# Patient Record
Sex: Male | Born: 1935 | Race: White | Hispanic: No | Marital: Married | Smoking: Never smoker
Health system: Southern US, Community
[De-identification: ages and names within clinical notes are randomized; demographics above are authoritative.]

## PROBLEM LIST (undated history)

## (undated) DIAGNOSIS — I2510 Atherosclerotic heart disease of native coronary artery without angina pectoris: Secondary | ICD-10-CM

## (undated) DIAGNOSIS — I639 Cerebral infarction, unspecified: Secondary | ICD-10-CM

## (undated) DIAGNOSIS — I10 Essential (primary) hypertension: Secondary | ICD-10-CM

## (undated) DIAGNOSIS — G4733 Obstructive sleep apnea (adult) (pediatric): Secondary | ICD-10-CM

## (undated) DIAGNOSIS — E785 Hyperlipidemia, unspecified: Secondary | ICD-10-CM

## (undated) HISTORY — PX: APPENDECTOMY: SHX54

## (undated) HISTORY — DX: Atherosclerotic heart disease of native coronary artery without angina pectoris: I25.10

## (undated) HISTORY — DX: Hyperlipidemia, unspecified: E78.5

## (undated) HISTORY — DX: Essential (primary) hypertension: I10

## (undated) HISTORY — PX: OTHER SURGICAL HISTORY: SHX169

## (undated) HISTORY — DX: Cerebral infarction, unspecified: I63.9

## (undated) HISTORY — PX: BACK SURGERY: SHX140

## (undated) HISTORY — PX: TOTAL KNEE ARTHROPLASTY: SHX125

## (undated) HISTORY — DX: Obstructive sleep apnea (adult) (pediatric): G47.33

---

## 1998-03-07 ENCOUNTER — Inpatient Hospital Stay (HOSPITAL_COMMUNITY): Admission: EM | Admit: 1998-03-07 | Discharge: 1998-03-15 | Payer: Self-pay | Admitting: Emergency Medicine

## 1999-01-26 ENCOUNTER — Ambulatory Visit (HOSPITAL_COMMUNITY): Admission: RE | Admit: 1999-01-26 | Discharge: 1999-01-26 | Payer: Self-pay | Admitting: Interventional Cardiology

## 2002-01-25 ENCOUNTER — Emergency Department (HOSPITAL_COMMUNITY): Admission: EM | Admit: 2002-01-25 | Discharge: 2002-01-25 | Payer: Self-pay | Admitting: Emergency Medicine

## 2002-01-25 ENCOUNTER — Encounter: Payer: Self-pay | Admitting: Emergency Medicine

## 2002-02-21 ENCOUNTER — Ambulatory Visit (HOSPITAL_COMMUNITY): Admission: RE | Admit: 2002-02-21 | Discharge: 2002-02-21 | Payer: Self-pay

## 2002-06-11 ENCOUNTER — Emergency Department (HOSPITAL_COMMUNITY): Admission: EM | Admit: 2002-06-11 | Discharge: 2002-06-11 | Payer: Self-pay | Admitting: Emergency Medicine

## 2002-06-11 ENCOUNTER — Encounter: Payer: Self-pay | Admitting: Emergency Medicine

## 2003-12-23 ENCOUNTER — Ambulatory Visit (HOSPITAL_COMMUNITY): Admission: RE | Admit: 2003-12-23 | Discharge: 2003-12-23 | Payer: Self-pay | Admitting: Cardiology

## 2005-04-27 ENCOUNTER — Encounter: Admission: RE | Admit: 2005-04-27 | Discharge: 2005-04-27 | Payer: Self-pay | Admitting: Family Medicine

## 2005-07-16 ENCOUNTER — Inpatient Hospital Stay (HOSPITAL_COMMUNITY): Admission: RE | Admit: 2005-07-16 | Discharge: 2005-07-21 | Payer: Self-pay | Admitting: Orthopedic Surgery

## 2005-07-21 ENCOUNTER — Inpatient Hospital Stay
Admission: RE | Admit: 2005-07-21 | Discharge: 2005-07-27 | Payer: Self-pay | Admitting: Physical Medicine & Rehabilitation

## 2005-07-21 ENCOUNTER — Ambulatory Visit: Payer: Self-pay | Admitting: Physical Medicine & Rehabilitation

## 2005-08-31 ENCOUNTER — Ambulatory Visit: Payer: Self-pay | Admitting: Internal Medicine

## 2006-03-08 ENCOUNTER — Ambulatory Visit (HOSPITAL_COMMUNITY): Admission: RE | Admit: 2006-03-08 | Discharge: 2006-03-08 | Payer: Self-pay | Admitting: Orthopedic Surgery

## 2006-03-25 ENCOUNTER — Ambulatory Visit: Payer: Self-pay | Admitting: Family Medicine

## 2006-03-26 ENCOUNTER — Encounter: Admission: RE | Admit: 2006-03-26 | Discharge: 2006-03-26 | Payer: Self-pay | Admitting: Orthopedic Surgery

## 2006-03-26 ENCOUNTER — Ambulatory Visit: Payer: Self-pay | Admitting: Family Medicine

## 2006-04-06 ENCOUNTER — Ambulatory Visit (HOSPITAL_BASED_OUTPATIENT_CLINIC_OR_DEPARTMENT_OTHER): Admission: RE | Admit: 2006-04-06 | Discharge: 2006-04-06 | Payer: Self-pay | Admitting: Family Medicine

## 2006-04-07 ENCOUNTER — Ambulatory Visit: Payer: Self-pay | Admitting: Internal Medicine

## 2006-04-16 ENCOUNTER — Ambulatory Visit: Payer: Self-pay | Admitting: Gastroenterology

## 2006-04-16 ENCOUNTER — Encounter: Admission: RE | Admit: 2006-04-16 | Discharge: 2006-04-16 | Payer: Self-pay | Admitting: Orthopedic Surgery

## 2006-04-19 ENCOUNTER — Ambulatory Visit: Payer: Self-pay | Admitting: Internal Medicine

## 2006-05-06 ENCOUNTER — Ambulatory Visit: Payer: Self-pay | Admitting: Gastroenterology

## 2006-05-13 ENCOUNTER — Encounter: Admission: RE | Admit: 2006-05-13 | Discharge: 2006-05-13 | Payer: Self-pay | Admitting: Orthopedic Surgery

## 2006-05-24 ENCOUNTER — Ambulatory Visit: Payer: Self-pay | Admitting: Gastroenterology

## 2006-05-24 ENCOUNTER — Encounter: Payer: Self-pay | Admitting: Internal Medicine

## 2006-05-24 ENCOUNTER — Encounter (INDEPENDENT_AMBULATORY_CARE_PROVIDER_SITE_OTHER): Payer: Self-pay | Admitting: Specialist

## 2006-05-24 DIAGNOSIS — D126 Benign neoplasm of colon, unspecified: Secondary | ICD-10-CM | POA: Insufficient documentation

## 2006-05-24 LAB — HM COLONOSCOPY: HM Colonoscopy: ABNORMAL

## 2006-06-07 ENCOUNTER — Ambulatory Visit: Payer: Self-pay | Admitting: Family Medicine

## 2006-06-21 ENCOUNTER — Ambulatory Visit: Payer: Self-pay | Admitting: Family Medicine

## 2006-07-05 ENCOUNTER — Ambulatory Visit: Payer: Self-pay | Admitting: Family Medicine

## 2006-07-15 ENCOUNTER — Ambulatory Visit: Payer: Self-pay | Admitting: Family Medicine

## 2006-07-15 LAB — CONVERTED CEMR LAB
CO2: 29 meq/L (ref 19–32)
Calcium: 9.2 mg/dL (ref 8.4–10.5)
Creatinine, Ser: 1.5 mg/dL (ref 0.4–1.5)
GFR calc non Af Amer: 49 mL/min
Glomerular Filtration Rate, Af Am: 60 mL/min/{1.73_m2}
Glucose, Bld: 75 mg/dL (ref 70–99)
Potassium: 4.2 meq/L (ref 3.5–5.1)
Total Protein: 6.7 g/dL (ref 6.0–8.3)

## 2006-07-19 ENCOUNTER — Ambulatory Visit: Payer: Self-pay | Admitting: Family Medicine

## 2006-07-23 ENCOUNTER — Encounter: Admission: RE | Admit: 2006-07-23 | Discharge: 2006-07-23 | Payer: Self-pay | Admitting: Family Medicine

## 2006-07-30 ENCOUNTER — Encounter: Admission: RE | Admit: 2006-07-30 | Discharge: 2006-07-30 | Payer: Self-pay | Admitting: Orthopedic Surgery

## 2006-10-01 DIAGNOSIS — M810 Age-related osteoporosis without current pathological fracture: Secondary | ICD-10-CM | POA: Insufficient documentation

## 2006-10-01 DIAGNOSIS — I10 Essential (primary) hypertension: Secondary | ICD-10-CM | POA: Insufficient documentation

## 2006-10-01 DIAGNOSIS — R0681 Apnea, not elsewhere classified: Secondary | ICD-10-CM

## 2006-10-01 DIAGNOSIS — D649 Anemia, unspecified: Secondary | ICD-10-CM

## 2006-10-06 DIAGNOSIS — E119 Type 2 diabetes mellitus without complications: Secondary | ICD-10-CM | POA: Insufficient documentation

## 2006-10-06 DIAGNOSIS — I2510 Atherosclerotic heart disease of native coronary artery without angina pectoris: Secondary | ICD-10-CM | POA: Insufficient documentation

## 2006-10-06 DIAGNOSIS — E785 Hyperlipidemia, unspecified: Secondary | ICD-10-CM

## 2006-10-22 ENCOUNTER — Encounter: Admission: RE | Admit: 2006-10-22 | Discharge: 2006-10-22 | Payer: Self-pay | Admitting: Neurology

## 2006-12-04 ENCOUNTER — Ambulatory Visit: Payer: Self-pay | Admitting: Family Medicine

## 2007-02-27 ENCOUNTER — Encounter (INDEPENDENT_AMBULATORY_CARE_PROVIDER_SITE_OTHER): Payer: Self-pay | Admitting: Family Medicine

## 2007-05-05 ENCOUNTER — Encounter (INDEPENDENT_AMBULATORY_CARE_PROVIDER_SITE_OTHER): Payer: Self-pay | Admitting: Family Medicine

## 2007-12-01 ENCOUNTER — Encounter (INDEPENDENT_AMBULATORY_CARE_PROVIDER_SITE_OTHER): Payer: Self-pay | Admitting: Family Medicine

## 2008-04-16 ENCOUNTER — Encounter: Payer: Self-pay | Admitting: Family Medicine

## 2008-07-09 ENCOUNTER — Encounter: Payer: Self-pay | Admitting: Internal Medicine

## 2008-07-18 ENCOUNTER — Encounter: Payer: Self-pay | Admitting: Family Medicine

## 2008-07-19 ENCOUNTER — Ambulatory Visit: Payer: Self-pay | Admitting: Family Medicine

## 2008-07-19 ENCOUNTER — Telehealth (INDEPENDENT_AMBULATORY_CARE_PROVIDER_SITE_OTHER): Payer: Self-pay | Admitting: *Deleted

## 2008-07-19 LAB — CONVERTED CEMR LAB
AST: 22 units/L (ref 0–37)
Albumin: 3.6 g/dL (ref 3.5–5.2)
Alkaline Phosphatase: 93 units/L (ref 39–117)
BUN: 25 mg/dL — ABNORMAL HIGH (ref 6–23)
Basophils Relative: 0.6 % (ref 0.0–3.0)
Bilirubin Urine: NEGATIVE
Bilirubin, Direct: 0.2 mg/dL (ref 0.0–0.3)
Calcium: 9 mg/dL (ref 8.4–10.5)
Chloride: 106 meq/L (ref 96–112)
Creatinine, Ser: 1.5 mg/dL (ref 0.4–1.5)
Eosinophils Absolute: 0.1 10*3/uL (ref 0.0–0.7)
Eosinophils Relative: 1.1 % (ref 0.0–5.0)
GFR calc non Af Amer: 49 mL/min
Hemoglobin: 12.8 g/dL — ABNORMAL LOW (ref 13.0–17.0)
Lipase: 22 units/L (ref 11.0–59.0)
MCHC: 35.3 g/dL (ref 30.0–36.0)
MCV: 91.9 fL (ref 78.0–100.0)
Monocytes Absolute: 0.9 10*3/uL (ref 0.1–1.0)
Monocytes Relative: 9.9 % (ref 3.0–12.0)
Neutro Abs: 5.1 10*3/uL (ref 1.4–7.7)
Neutrophils Relative %: 59.3 % (ref 43.0–77.0)
Nitrite: NEGATIVE
RBC: 3.95 M/uL — ABNORMAL LOW (ref 4.22–5.81)
Specific Gravity, Urine: 1.02
WBC Urine, dipstick: NEGATIVE
WBC: 8.8 10*3/uL (ref 4.5–10.5)

## 2008-07-20 ENCOUNTER — Ambulatory Visit: Payer: Self-pay | Admitting: Internal Medicine

## 2008-07-20 ENCOUNTER — Telehealth (INDEPENDENT_AMBULATORY_CARE_PROVIDER_SITE_OTHER): Payer: Self-pay | Admitting: *Deleted

## 2008-12-08 ENCOUNTER — Ambulatory Visit: Payer: Self-pay | Admitting: Internal Medicine

## 2008-12-10 ENCOUNTER — Telehealth: Payer: Self-pay | Admitting: Internal Medicine

## 2008-12-14 ENCOUNTER — Telehealth: Payer: Self-pay | Admitting: Internal Medicine

## 2008-12-20 ENCOUNTER — Telehealth: Payer: Self-pay | Admitting: Internal Medicine

## 2008-12-27 ENCOUNTER — Ambulatory Visit: Payer: Self-pay | Admitting: Internal Medicine

## 2008-12-29 LAB — CONVERTED CEMR LAB
CO2: 28 meq/L (ref 19–32)
Chloride: 108 meq/L (ref 96–112)
Creatinine, Ser: 1.3 mg/dL (ref 0.4–1.5)
GFR calc non Af Amer: 57.5 mL/min (ref >60)
Glucose, Bld: 117 mg/dL — ABNORMAL HIGH (ref 70–99)
Iron: 80 ug/dL (ref 42–165)
PSA: 1.39 ng/mL (ref 0.10–4.00)
Vitamin B-12: 820 pg/mL (ref 211–911)

## 2009-01-05 ENCOUNTER — Encounter: Payer: Self-pay | Admitting: Internal Medicine

## 2009-01-24 ENCOUNTER — Telehealth (INDEPENDENT_AMBULATORY_CARE_PROVIDER_SITE_OTHER): Payer: Self-pay | Admitting: *Deleted

## 2009-02-04 ENCOUNTER — Encounter: Payer: Self-pay | Admitting: Internal Medicine

## 2009-04-23 ENCOUNTER — Emergency Department (HOSPITAL_COMMUNITY): Admission: EM | Admit: 2009-04-23 | Discharge: 2009-04-23 | Payer: Self-pay | Admitting: Emergency Medicine

## 2009-04-29 ENCOUNTER — Encounter: Payer: Self-pay | Admitting: Internal Medicine

## 2009-06-27 ENCOUNTER — Ambulatory Visit: Payer: Self-pay | Admitting: Internal Medicine

## 2009-06-27 DIAGNOSIS — K5900 Constipation, unspecified: Secondary | ICD-10-CM | POA: Insufficient documentation

## 2009-06-27 DIAGNOSIS — M1990 Unspecified osteoarthritis, unspecified site: Secondary | ICD-10-CM | POA: Insufficient documentation

## 2009-08-21 ENCOUNTER — Emergency Department (HOSPITAL_COMMUNITY): Admission: EM | Admit: 2009-08-21 | Discharge: 2009-08-21 | Payer: Self-pay | Admitting: Emergency Medicine

## 2009-10-18 ENCOUNTER — Encounter: Payer: Self-pay | Admitting: Internal Medicine

## 2010-05-30 ENCOUNTER — Encounter: Payer: Self-pay | Admitting: Internal Medicine

## 2010-06-12 ENCOUNTER — Telehealth: Payer: Self-pay | Admitting: Internal Medicine

## 2010-08-02 ENCOUNTER — Encounter: Payer: Self-pay | Admitting: Internal Medicine

## 2010-09-04 ENCOUNTER — Ambulatory Visit: Payer: Self-pay | Admitting: Family Medicine

## 2010-09-05 ENCOUNTER — Telehealth: Payer: Self-pay | Admitting: Internal Medicine

## 2010-09-20 ENCOUNTER — Telehealth: Payer: Self-pay | Admitting: Internal Medicine

## 2010-09-20 DIAGNOSIS — IMO0002 Reserved for concepts with insufficient information to code with codable children: Secondary | ICD-10-CM | POA: Insufficient documentation

## 2010-09-21 ENCOUNTER — Telehealth: Payer: Self-pay | Admitting: Internal Medicine

## 2010-10-03 ENCOUNTER — Encounter: Payer: Self-pay | Admitting: Internal Medicine

## 2010-10-08 ENCOUNTER — Encounter: Payer: Self-pay | Admitting: Neurology

## 2010-10-09 ENCOUNTER — Ambulatory Visit: Admit: 2010-10-09 | Payer: Self-pay | Admitting: Internal Medicine

## 2010-10-10 ENCOUNTER — Ambulatory Visit
Admission: RE | Admit: 2010-10-10 | Discharge: 2010-10-10 | Payer: Self-pay | Source: Home / Self Care | Attending: Internal Medicine | Admitting: Internal Medicine

## 2010-10-10 ENCOUNTER — Ambulatory Visit: Admit: 2010-10-10 | Payer: Self-pay | Admitting: Internal Medicine

## 2010-10-17 NOTE — Letter (Signed)
Summary: [facility] Endocrinology & Diabetes  [facility] Endocrinology & Diabetes   Imported By: Lanelle Bal 06/08/2010 07:55:14  _____________________________________________________________________  External Attachment:    Type:   Image     Comment:   External Document

## 2010-10-17 NOTE — Progress Notes (Signed)
Summary: Tramadol mail order  Phone Note Refill Request Message from:  spouse on June 12, 2010 3:30 PM  Refills Requested: Medication #1:  TRAMADOL HCL 50 MG TABS take one tablet in the morning and one ta blet in the evening as needed [facility]--90 day supply. Please advise.  Initial call taken by: Lucious Groves CMA,  June 12, 2010 3:31 PM  Follow-up for Phone Call        ok Rx 180 and 1 RF Follow-up by: [facility] E. Mianna Iezzi MD,  June 13, 2010 11:53 AM    Prescriptions: TRAMADOL HCL 50 MG TABS (TRAMADOL HCL) take one tablet in the morning and one ta blet in the evening as needed  #180 x 1   Entered by:   Army Fossa CMA   Authorized by:   Nolon Rod. Mahira Petras MD   Signed by:   Army Fossa CMA on 06/13/2010   Method used:   Faxed to ...       [facility] (mail-order)             , [place]         Ph:        Fax: [phone]   RxID:   [id number]

## 2010-10-17 NOTE — Letter (Signed)
Summary: [facility] Endocrinology & Diabetes  [facility]ology [facility] Diabetes   Imported By: Lanelle Bal 10/26/2009 08:54:27  _____________________________________________________________________  External Attachment:    Type:   Image     Comment:   External Document

## 2010-10-17 NOTE — Letter (Signed)
Summary: [facility]   Imported By: Lanelle Bal 08/15/2010 10:17:48  _____________________________________________________________________  External Attachment:    Type:   Image     Comment:   External Document

## 2010-10-19 NOTE — Progress Notes (Signed)
Summary: due for a office visit  Phone Note Outgoing Call   Summary of Call: advise  patient, he has not been seen in long time. He is due for a ROV. Please arrange if patient is willing to [facility] E. Paz MD  September 05, 2010 2:32 PM   Follow-up for Phone Call        Per pts wife- he is not ready for an OV at this time. He did see Dr.Tabori yesterday for an acute reason. Army Fossa CMA  September 05, 2010 2:35 PM

## 2010-10-19 NOTE — Assessment & Plan Note (Signed)
Summary: discuss referral   Vital Signs:  Patient profile:   77 year old male Weight:      230.38 pounds Pulse rate:   79 / minute Pulse rhythm:   regular BP sitting:   142 / 82  (left arm) Cuff size:   large  Vitals Entered By: Adrian Spencer (October 10, 2010 3:03 PM) CC: Pt states he is here to just "talk"  Comments not fasting  [facility]    History of Present Illness: patient here at the request of his wife Adrian Spencer), she reported a long history of a bizarre behavior on  [place], she said that his temper was getting worse. Adrian Spencer was seen a few weeks ago, she was definitely anxious and depressed and was prescribed SSRIs. I saw her today, she is doing much better and she reports that her relationship with her husband has improved tremendously.  Today, Adrian Spencer reports that   Adrian Spencer  has been quite "ill" and sometimes even verbally violent with him for the last few months. He feels that most of the problems between them were generated by Adrian Spencer's behavior  He admits that at some point he was a little "rough" during intercourse but he did not mean to hurt her. He also  admits  that from time to time he is in a  bad mood but nothing out of the ordinary. He does not believe that he has any behavioral or mental problem.  he agrees with his wife in the fact that she is doing much better on SSRIs.  ROS Denies any anxiety or depression per se No recent low CBGs   Current Medications (verified): 1)  Vytorin 10-80 Mg Tabs (Ezetimibe-Simvastatin) .... Take One Tablet Daily 2)  Actos 15 Mg Tabs (Pioglitazone Hcl) .... Take One Tablet Every Morning 3)  Glimepiride 2 Mg Tabs (Glimepiride) .... Take One Tablet Each Morning 4)  Isosorbide Mononitrate Cr 60 Mg Xr24h-Tab (Isosorbide Mononitrate) .... Take One Tablet Daily 5)  Metoprolol Tartrate 50 Mg Tabs (Metoprolol Tartrate) .... Take One Tablet in The Morning and One Tablet in The Evening. 6)  Tramadol Hcl 50 Mg Tabs  (Tramadol Hcl) .... Take One Tablet in The Morning and One Ta Blet in The Evening As Needed 7)  Welchol 625 Mg Tabs (Colesevelam Hcl) .... Take 2 Tablets in The Morning and 2 Tablets in The Evening 8)  Citracal Plus  Tabs (Multiple Minerals-Vitamins) .... Take 4  Tablet Daily 9)  Centrum Silver  Tabs (Multiple Vitamins-Minerals) .... Once Daily 10)  Aspirin 325 Mg Tabs (Aspirin) .Marland Kitchen.. 1 By Mouth Once Daily 11)  Niacin Cr 1000 Mg Cr-Tabs (Niacin) 12)  Tessalon 200 Mg Caps (Benzonatate) .... Take One Capsule By Mouth Three Times A Day As Needed For Cough 13)  Cheratussin Ac 100-10 Mg/45ml Syrp (Guaifenesin-Codeine) .Marland Kitchen.. 1-2 Tsps Q4-6 As Needed For Cough  Allergies (verified): No Known Drug Allergies  Past History:  Past Medical History: Reviewed history from 06/27/2009 and no changes required. Hypertension Osteoporosis Coronary artery disease, S/P CABG--Dr Katrinka Blazing Diabetes mellitus, type II-- Dr Lucianne Muss Hyperlipidemia-- Dr Lucianne Muss  stroke in the 80s OSA severe per Sleep study 7-07, has a CPAP hardly ever uses it  Past Surgical History: Reviewed history from 12/27/2008 and no changes required. Appendectomy Coronary artery bypass graft 1999 Total knee replacement Back Surgery  Physical Exam  General:  alert and well-developed.   Psych:  Oriented X3, memory intact for recent and remote, normally interactive, good eye contact, not anxious appearing, and  not depressed appearing.     Impression & Recommendations:  Problem # 1:  ? of ANXIETY (ICD-300.00) see history of present illness Here at the request of the wife, she reported bizarre behavior  and a  temper . Per patient's own report today and by my exam today, Adrian Spencer is doing well, no anxiety, depression, he seems collected. Actually the relationship between them has improved tremendously after  Adrian Spencer started SSRIs. Plan:  observation now that I have both sides of the story, I more worried about  Adrian Spencer than  Adrian Spencer. I asked him  to call me if Adrian Spencer gets worse  Complete Medication List: 1)  Vytorin 10-80 Mg Tabs (Ezetimibe-simvastatin) .... Take one tablet daily 2)  Actos 15 Mg Tabs (Pioglitazone hcl) .... Take one tablet every morning 3)  Glimepiride 2 Mg Tabs (Glimepiride) .... Take one tablet each morning 4)  Isosorbide Mononitrate Cr 60 Mg Xr24h-tab (Isosorbide mononitrate) .... Take one tablet daily 5)  Metoprolol Tartrate 50 Mg Tabs (Metoprolol tartrate) .... Take one tablet in the morning and one tablet in the evening. 6)  Tramadol Hcl 50 Mg Tabs (Tramadol hcl) .... Take one tablet in the morning and one ta blet in the evening as needed 7)  Welchol 625 Mg Tabs (Colesevelam hcl) .... Take 2 tablets in the morning and 2 tablets in the evening 8)  Citracal Plus Tabs (Multiple minerals-vitamins) .... Take 4  tablet daily 9)  Centrum Silver Tabs (Multiple vitamins-minerals) .... Once daily 10)  Aspirin 325 Mg Tabs (Aspirin) .Marland Kitchen.. 1 by mouth once daily 11)  Niacin Cr 1000 Mg Cr-tabs (Niacin) 12)  Tessalon 200 Mg Caps (Benzonatate) .... Take one capsule by mouth three times a day as needed for cough 13)  Cheratussin Ac 100-10 Mg/68ml Syrp (Guaifenesin-codeine) .Marland Kitchen.. 1-2 tsps q4-6 as needed for cough   Orders Added: 1)  Est. Patient Level III [96295]

## 2010-10-19 NOTE — Assessment & Plan Note (Signed)
Summary: CONGESTION,COUGH X1//PH   Vital Signs:  Patient profile:   77 year old male Weight:      235 pounds BMI:     30.28 O2 Sat:      96 % on Room air Temp:     97.6 degrees F oral Pulse rate:   84 / minute BP sitting:   116 / 74  (left arm)  Vitals Entered By: Doristine Devoid CMA (September 04, 2010 3:45 PM)  O2 Flow:  Room air CC: cough and chest congestion x1 wk    History of Present Illness: 77 yo man here today for cough and chest congestion.  + nasal congestion.  + fatigue.  'i just feel tired and bad'.  sxs started 10 days ago, 'well 3 weeks ago actually but it just got bad 10 days ago'.  cough is productive of green sputum.  no fevers.  + sick contacts.  + sinus pain.  no ear pain.  Current Medications (verified): 1)  Vytorin 10-80 Mg Tabs (Ezetimibe-Simvastatin) .... Take One Tablet Daily 2)  Actos 15 Mg Tabs (Pioglitazone Hcl) .... Take One Tablet Every Morning 3)  Ramipril 2.5 Mg Caps (Ramipril) .Marland Kitchen.. 1 By Mouth Once Daily 4)  Glimepiride 2 Mg Tabs (Glimepiride) .... Take One Tablet Each Morning 5)  Isosorbide Mononitrate Cr 60 Mg Xr24h-Tab (Isosorbide Mononitrate) .... Take One Tablet Daily 6)  Metoprolol Tartrate 50 Mg Tabs (Metoprolol Tartrate) .... Take One Tablet in The Morning and One Tablet in The Evening. 7)  Tramadol Hcl 50 Mg Tabs (Tramadol Hcl) .... Take One Tablet in The Morning and One Ta Blet in The Evening As Needed 8)  Welchol 625 Mg Tabs (Colesevelam Hcl) .... Take 3 Tablets in The Morning and 3 Tablets in The Evening 9)  B-6 Folic Acid [phone] Mcg-Mcg-Mg Caps (Cobalamine Combinations) .... Take One Tablet Daily 10)  Citracal Plus  Tabs (Multiple Minerals-Vitamins) .... Take 4  Tablet Daily 11)  Centrum Silver  Tabs (Multiple Vitamins-Minerals) .... Once Daily 12)  Aspirin 325 Mg Tabs (Aspirin) .Marland Kitchen.. 1 By Mouth Once Daily 13)  Niacin Cr 1000 Mg Cr-Tabs (Niacin)  Allergies (verified): No Known Drug Allergies  Review of Systems      See  HPI  Physical Exam  General:  alert and well-developed.   Head:  NCAT, no TTP over sinuses Eyes:  no injxn or inflammation Ears:  R ear normal and L ear normal.   Nose:  + congestion Mouth:  no pharyngeal erythema or exudate Neck:  No deformities, masses, or tenderness noted. Lungs:  normal respiratory effort, no intercostal retractions, no accessory muscle use, and normal breath sounds.     Impression & Recommendations:  Problem # 1:  BRONCHITIS- ACUTE (ICD-466.0) Assessment New  given duration of sxs will start abx.  cough meds as needed.  reviewed supportive care and red flags that should prompt return.  Pt expresses understanding and is in agreement w/ this plan. His updated medication list for this problem includes:    Azithromycin 250 Mg Tabs (Azithromycin) .Marland Kitchen... 2 by  mouth today and then 1 daily for 4 days    Tessalon 200 Mg Caps (Benzonatate) .Marland Kitchen... Take one capsule by mouth three times a day as needed for cough    Cheratussin Ac 100-10 Mg/70ml Syrp (Guaifenesin-codeine) .Marland Kitchen... 1-2 tsps q4-6 as needed for cough  Orders: Prescription Created Electronically [phone])  Complete Medication List: 1)  Vytorin 10-80 Mg Tabs (Ezetimibe-simvastatin) .... Take one tablet daily 2)  Actos 15  Mg Tabs (Pioglitazone hcl) .... Take one tablet every morning 3)  Ramipril 2.5 Mg Caps (Ramipril) .Marland Kitchen.. 1 by mouth once daily 4)  Glimepiride 2 Mg Tabs (Glimepiride) .... Take one tablet each morning 5)  Isosorbide Mononitrate Cr 60 Mg Xr24h-tab (Isosorbide mononitrate) .... Take one tablet daily 6)  Metoprolol Tartrate 50 Mg Tabs (Metoprolol tartrate) .... Take one tablet in the morning and one tablet in the evening. 7)  Tramadol Hcl 50 Mg Tabs (Tramadol hcl) .... Take one tablet in the morning and one ta blet in the evening as needed 8)  Welchol 625 Mg Tabs (Colesevelam hcl) .... Take 3 tablets in the morning and 3 tablets in the evening 9)  B-6 Folic Acid [phone] Mcg-mcg-mg Caps (Cobalamine  combinations) .... Take one tablet daily 10)  Citracal Plus Tabs (Multiple minerals-vitamins) .... Take 4  tablet daily 11)  Centrum Silver Tabs (Multiple vitamins-minerals) .... Once daily 12)  Aspirin 325 Mg Tabs (Aspirin) .Marland Kitchen.. 1 by mouth once daily 13)  Niacin Cr 1000 Mg Cr-tabs (Niacin) 14)  Azithromycin 250 Mg Tabs (Azithromycin) .... 2 by  mouth today and then 1 daily for 4 days 15)  Tessalon 200 Mg Caps (Benzonatate) .... Take one capsule by mouth three times a day as needed for cough 16)  Cheratussin Ac 100-10 Mg/30ml Syrp (Guaifenesin-codeine) .Marland Kitchen.. 1-2 tsps q4-6 as needed for cough  Patient Instructions: 1)  Take the Azithromycin as directed for bronchitis 2)  Use the cough pills during the day and the syrup at night 3)  Add Mucinex to thin your congestion 4)  Coricidin HBP as needed for congestion 5)  Drink plenty of fluids 6)  Tylenol or ibuprofen as needed for pain or fever 7)  Rest! 8)  Hang in there! 9)  Happy Holidays! Prescriptions: CHERATUSSIN AC 100-10 MG/5ML SYRP (GUAIFENESIN-CODEINE) 1-2 tsps Q4-6 as needed for cough  #150 x 0   Entered and Authorized by:   Neena Rhymes MD   Signed by:   Neena Rhymes MD on 09/04/2010   Method used:   Print then Give to Patient   RxID:   [id number] TESSALON 200 MG CAPS (BENZONATATE) Take one capsule by mouth three times a day as needed for cough  #60 x 0   Entered and Authorized by:   Neena Rhymes MD   Signed by:   Neena Rhymes MD on 09/04/2010   Method used:   Electronically to        [facility] [phone][facility] (retail)       [address] [place], [place]  [address]       Ph: [phone] or [phone]       Fax: [phone]   RxID:   [id number] AZITHROMYCIN 250 MG  TABS (AZITHROMYCIN) 2 by  mouth today and then 1 daily for 4 days  #6 x 0   Entered and Authorized by:   Neena Rhymes MD   Signed by:   Neena Rhymes MD on 09/04/2010   Method used:   [facility]ronically to        [facility] [phone]*  (retail)       [address] [place], [place]  [address]       Ph: [phone] or [phone]       Fax: [phone]   RxID:   [id number]    Orders Added: 1)  Est. Patient Level III [71062] 2)  Prescription  Created Electronically [phone]

## 2010-10-19 NOTE — Progress Notes (Signed)
Summary: PSYCHIATRIC REFERRAL  Phone Note Outgoing Call   Call placed by: Magdalen Spatz [facility],  September 21, 2010 11:00 AM Call placed to: Patient Summary of Call: IN REFERENCE TO PSYCHIATRY REFERRAL, WITH PATIENT HAVING TRADITIONAL MEDICARE, DR. COTTLE'S PA, CLAY SHUGART WOULD SEE THE PATIENT UNDER THE SUPERVISION OF DR. COTTLE, BUT THEY REQUIRE PT CALL TO MAKE THE APPT.  I JUST CALLED & SPOKE WITH THE PATIENT, WHO STATED HE THINKS HE JUST NEEDS TO COME IN & DISCUSS THIS WITH DR. Jonell Krontz, AND THAT HE HAS TOO MUCH GOING ON THIS WEEK, AND DECLINED TO LET ME MAKE AN APPT WITH HIM TO SEE DR. Cheo Selvey, NOR WOULD HE TAKE DOWN THE CONTACT INFO TO SCHEDULE WITH DR. COTTLE'S OFFICE. Initial call taken by: Magdalen Spatz [facility],  September 21, 2010 11:00 AM  Follow-up for Phone Call        noted, thank you  Follow-up by: [facility] E. Kegan Mckeithan MD,  September 21, 2010 1:12 PM

## 2010-10-19 NOTE — Progress Notes (Signed)
Summary: psychiatry referral  Phone Note Outgoing Call   Summary of Call: I had an extensive conversation with the patient's wife yesterday, Adrian Spencer showing bizarre behavior. reportedly, the patient is agreeable to see a psychiatry. will arrange a psychiatry referral ASAP. Try Dr Jennelle Human or one of his associates [place] E. Catlin Aycock MD  September 20, 2010 6:14 PM   New Problems: UNSPECIFIED MENTAL OR BEHAVIORAL PROBLEM (ICD-V40.9)   New Problems: UNSPECIFIED MENTAL OR BEHAVIORAL PROBLEM (ICD-V40.9)

## 2010-10-25 NOTE — Letter (Signed)
Summary: [facility] Endocrinology & Diabetes  [facility] Endocrinology [facility] Diabetes   Imported By: Lanelle Bal 10/16/2010 14:01:07  _____________________________________________________________________  External Attachment:    Type:   Image     Comment:   External Document

## 2010-11-07 ENCOUNTER — Encounter: Payer: Self-pay | Admitting: Internal Medicine

## 2010-11-28 NOTE — Letter (Signed)
Summary: shoulder contussion, Fx, conservative Rx--- Orthopaedics  [facility]   Imported By: Lanelle Bal 11/15/2010 08:46:01  _____________________________________________________________________  External Attachment:    Type:   Image     Comment:   External Document

## 2010-12-26 ENCOUNTER — Ambulatory Visit (INDEPENDENT_AMBULATORY_CARE_PROVIDER_SITE_OTHER): Payer: Medicare Other | Admitting: Family Medicine

## 2010-12-26 ENCOUNTER — Encounter: Payer: Self-pay | Admitting: Family Medicine

## 2010-12-26 VITALS — BP 144/72 | HR 98 | Temp 98.5°F | Wt 226.8 lb

## 2010-12-26 DIAGNOSIS — R05 Cough: Secondary | ICD-10-CM

## 2010-12-26 MED ORDER — GUAIFENESIN-CODEINE 100-10 MG/5ML PO SYRP: 5.0000 mL | ORAL_SOLUTION | Freq: Three times a day (TID) | ORAL | Status: AC | PRN

## 2010-12-26 MED ORDER — AZITHROMYCIN 250 MG PO TABS: 250.0000 mg | ORAL_TABLET | Freq: Every day | ORAL | Status: AC

## 2010-12-26 NOTE — Patient Instructions (Signed)
Please take the Azithromycin for a bronchitis Use th cough syrup as needed Tylenol/Ibuprofen as needed for cough Drink plenty of fluids REST! If no improvement or at any time worsening- please call or go to the ER if you have shortness of breath Hang in there!!!

## 2010-12-26 NOTE — Assessment & Plan Note (Signed)
Will treat w/ Zpack to cover either bronchitis or CAP.  Cough meds provided- use prn.  Reviewed supportive care and red flags that should prompt return.  Pt expressed understanding and is in agreement w/ plan.

## 2010-12-26 NOTE — Progress Notes (Signed)
  Subjective:    Patient ID: Adrian Spencer, male    DOB: Feb 08, 1934, 77 y.o.   MRN: [id number]  HPI Cough- sxs started 1 week ago.  + nasal congestion.  Cough is intermittently productive.  No fevers.  + sick contacts.  Denies ear pain.  Denies facial pain/pressure.  + fatigue.  No hx of lung dz.   Review of Systems For ROS see HPI     Objective:   Physical Exam  Constitutional: He appears well-developed and well-nourished. No distress.  HENT:  Head: Normocephalic and atraumatic.  Right Ear: Tympanic membrane normal.  Left Ear: Tympanic membrane normal.  Nose: No mucosal edema or rhinorrhea. Right sinus exhibits no maxillary sinus tenderness and no frontal sinus tenderness. Left sinus exhibits no maxillary sinus tenderness and no frontal sinus tenderness.  Mouth/Throat: Mucous membranes are normal. No oropharyngeal exudate, posterior oropharyngeal edema or posterior oropharyngeal erythema.  Eyes: Conjunctivae and EOM are normal. Pupils are equal, round, and reactive to light.  Neck: Normal range of motion. Neck supple.  Cardiovascular: Normal rate, regular rhythm and normal heart sounds.   Pulmonary/Chest: Effort normal. No respiratory distress. He has no wheezes. He has rhonchi in the right upper field.       + hacking cough  Lymphadenopathy:    He has no cervical adenopathy.  Skin: Skin is warm and dry.          Assessment & Plan:

## 2011-01-26 ENCOUNTER — Ambulatory Visit: Payer: Medicare Other | Admitting: Internal Medicine

## 2011-01-27 IMAGING — CR DG KNEE COMPLETE 4+V*R*
4 series · 4 of 4 positions shown · non-contrast
Comparison: Right knee films of 04/23/2009

CLINICAL DATA: Fell today with abrasions and swelling

RIGHT KNEE - COMPLETE 4+ VIEW

[t knee ap right]
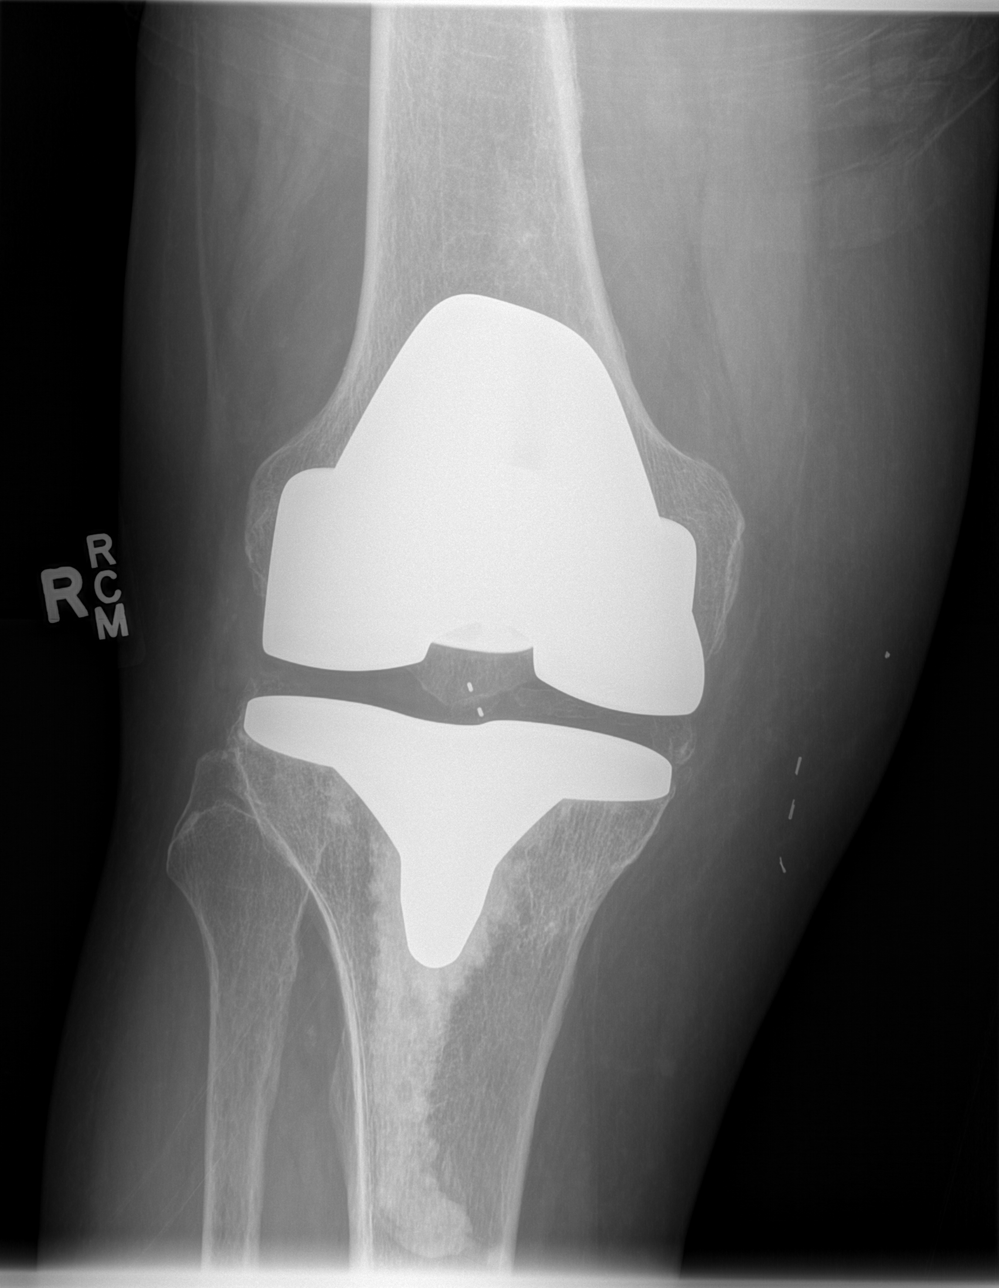

[t knee oblique right (1 of 2)]
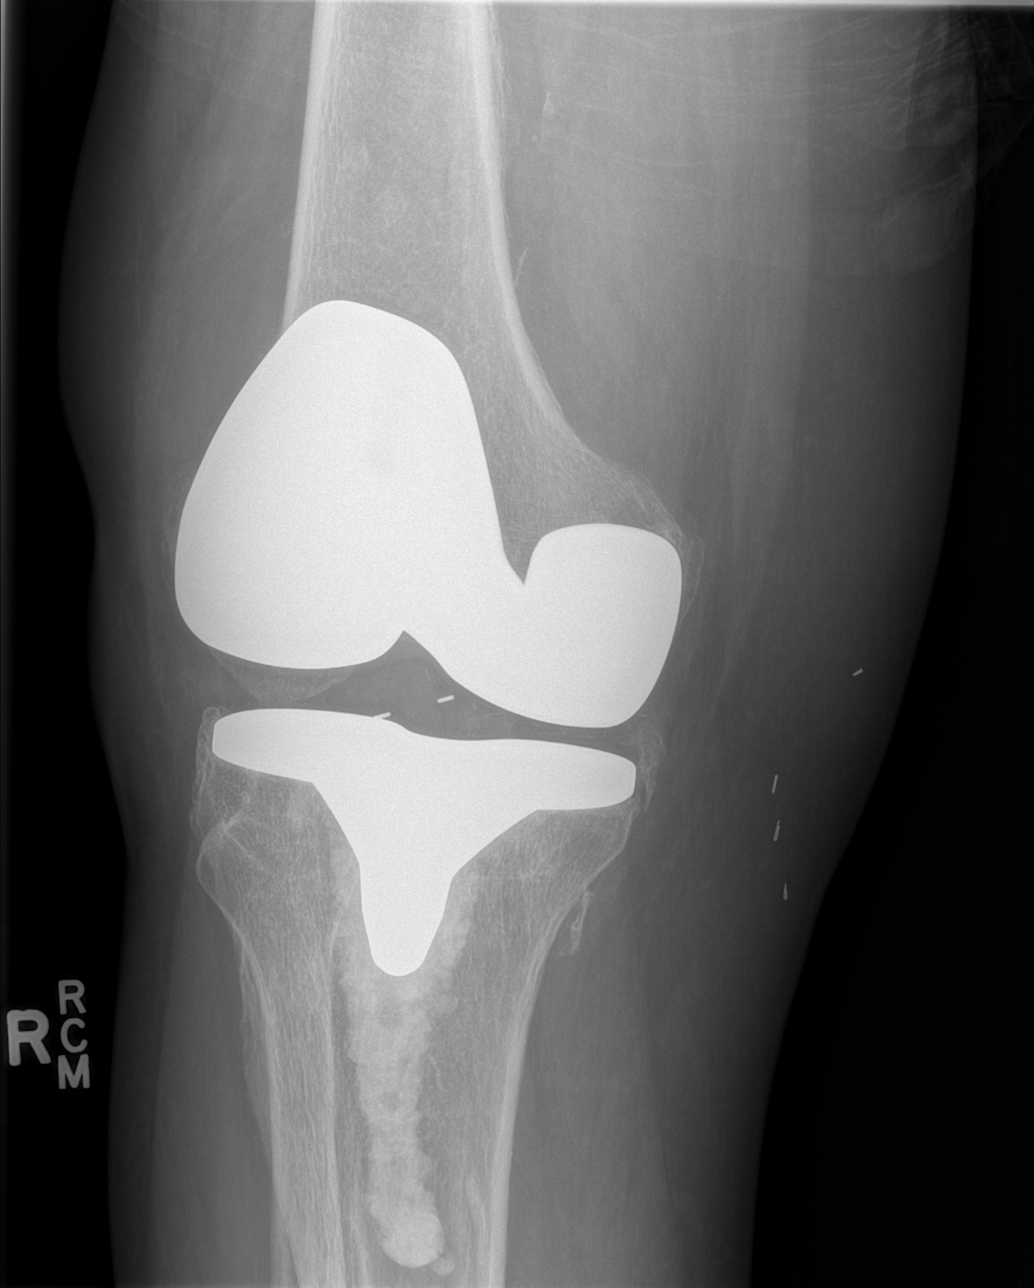

[t knee oblique right (2 of 2)]
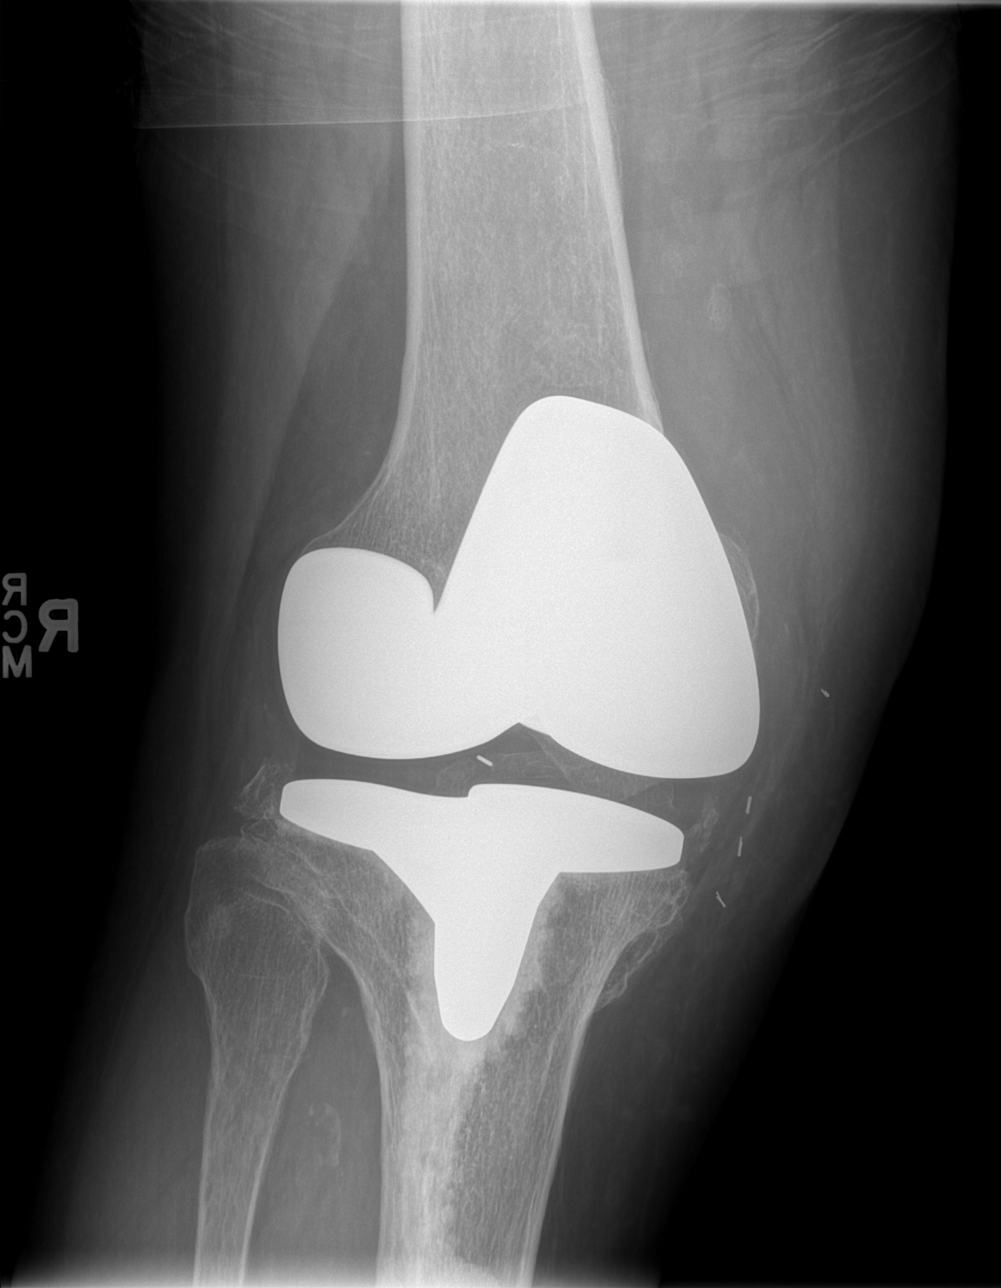

[t knee lat right]
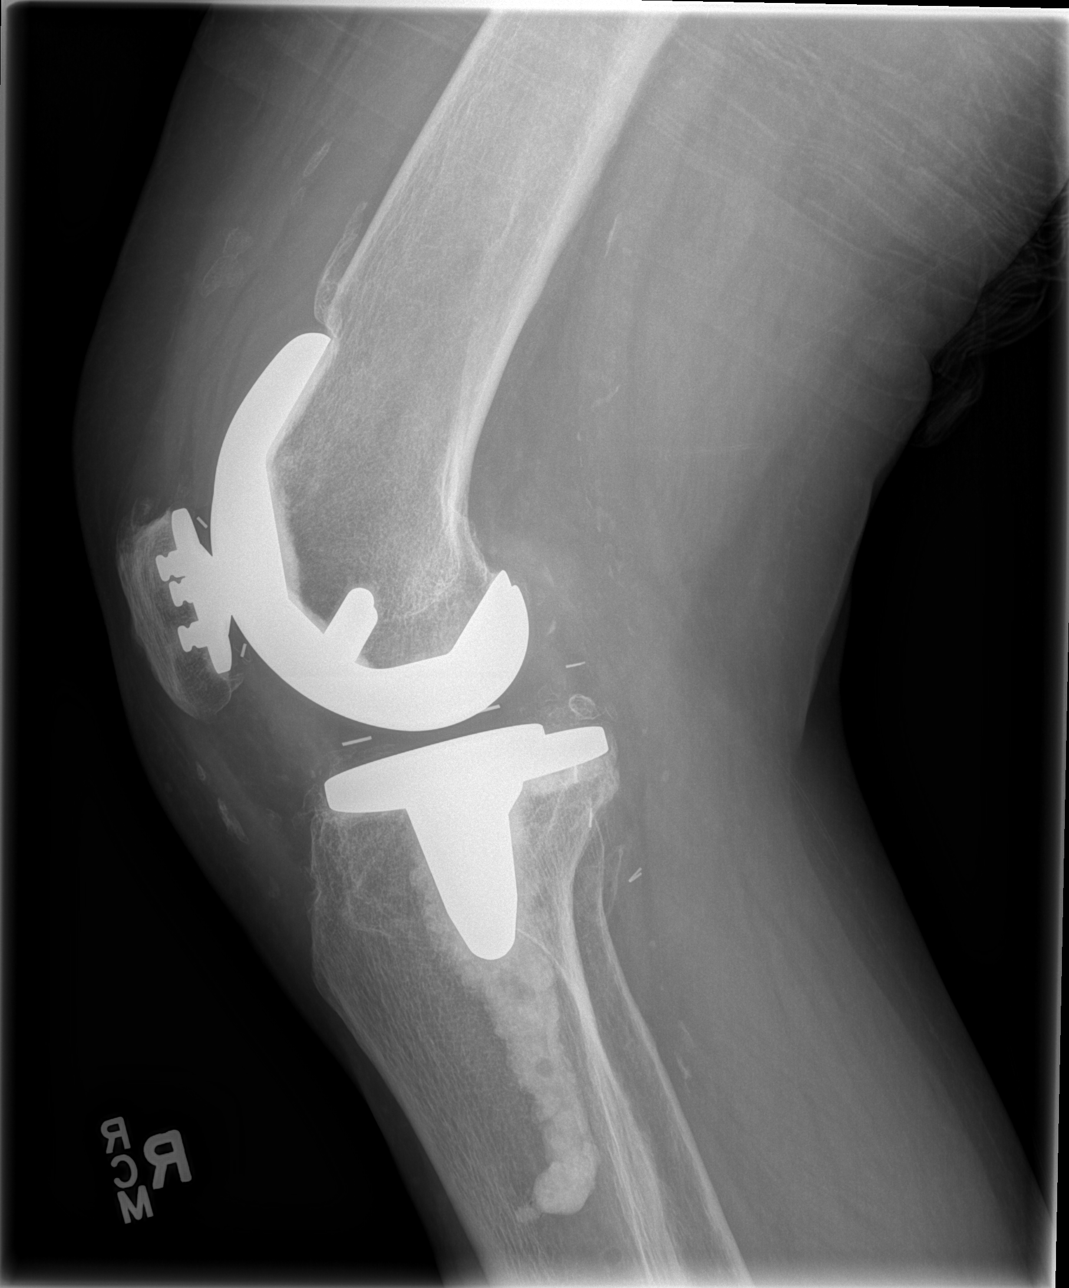

[4 of 4 positions shown; findings below may reference images not displayed]

FINDINGS: The femoral and tibial components of the right total knee
replacement are in good position and alignment.  Joint spaces
appear normal.  The bones are somewhat osteopenic.  No acute
fracture is seen and no effusion is noted.
IMPRESSION: Right total knee replacement in good position.  No acute bony
abnormality.

## 2011-02-02 NOTE — H&P (Signed)
Adrian Spencer, HAACK               ACCOUNT NO.:  [id number]   MEDICAL RECORD NO.:  [id number]           PATIENT TYPE:   LOCATION:                                 FACILITY:   PHYSICIAN:  John L. Rendall, M.D.  DATE OF BIRTH:  January 08, 1934   DATE OF ADMISSION:  07/16/2005  DATE OF DISCHARGE:                                HISTORY & PHYSICAL   CHIEF COMPLAINT:  Right lower extremity pain.   HISTORY OF PRESENT ILLNESS:  The patient is a pleasant 77 year old white  male with right lower extremity pain for approximately 1 year. The patient  felt that most of his pain was in his right hip.  He describes the pain as a  constant severe to extremely severe leg pain back in August of this year.  At that time, the patient was having waking pain, no relief with pain  medicines.  X-rays of the right hip were reviewed and showed the hip joint  to be within normal limits.  X-rays of the right knee were obtained and  showed bone-on-bone medial compartment.  At that time, the patient was given  a cortisone injection, and this relieved his pain.  Since that day on August  15, he has had little to no pain in the right knee.   ALLERGIES:  No known drug allergies.   MEDICATIONS:  1.  Actos 15 mg 1 tablet q.a.m.  2.  Altace 10 mg 1 q.a.m.  3.  Amaryl 2 mg 1 q.p.m.  4.  Metoprolol 50 mg 1 p.o. q.a.m. and q.p.m.  5.  Crestor 10 mg 1 p.o. q.a.m.  6.  Welchol 625 mg 3 tablets twice daily.  7.  Zetia 10 mg 1 tablet q.a.m.  8.  Aspirin 325 mg 1 q.p.m., stopped July 10, 2005.  9.  Folic acid 400 mcg 1 tablet q.a.m.  10. Citracal 600 mg 1 tablet q.a.m.  11. Multivitamins 1 q.a.m.  12. Imdur, questionable dosage, 1 q.a.m.   PAST SURGICAL HISTORY:  1.  Right knee arthroscopy with questionable meniscectomy in 1984.  2.  Back surgery in 1991.  3.  Open heart surgery with bypass grafting in 1999.   The patient denies any complications with the above procedures.  He received  no blood products.   SOCIAL  HISTORY:  The patient has a former history of smoking; he quit  approximately 20 years ago.  He occasionally has a glass of wine.  He is  married, has two grown children.  He lives in a one-story home that has a  ramp into the usual entrance.  He is semiretired but continues to work in  [employer].  The patient's primary care physician is Dr. Armond Hang  physicians at [facility].  His cardiologist is Dr. Verdis Prime with [facility]  Card[facility].  Phone number for Dr. Katrinka Blazing is [phone].   FAMILY HISTORY:  The patient's mother deceased at age 43, had a history of  hypertension, questionable history of heart disease, had dementia.  The  patient's father deceased at age 82, reportedly healthy until he had a hip  fracture and developed a wound infection postoperatively.  The patient has  two living brothers; one, age 45, has heart disease, diabetes mellitus, and  history of stroke.  He has a second brother, age 26, who has heart disease,  diabetes mellitus, and history of stroke.   REVIEW OF SYSTEMS:  The patient denies any recent cold, cough, fever, flu-  like symptoms.  Did receive a flu shot 1 week ago.  Has occasional chest  pain and shortness of breath with exertion approximately at the point where  he walks 2 to 3 blocks.  Denies any PND or orthopnea.  Wears dentures on the  bottom jaw line.  Wears glasses for reading.  Denies any GI symptoms.  He  has nocturia x1.  Otherwise, Review of Systems is negative or  noncontributory.   LIVING WILL:  The patient has a living will and power of attorney.  The  power of attorney is patient's wife, [name].   PHYSICAL EXAMINATION:  GENERAL:  The patient is a well-developed, well-  nourished male in no acute distress.  The patient walks without any  assistive devices.  The patient's mood and affect are appropriate, talks  easily with examiner.  VITAL SIGNS:  The patient's approximate height is 6 feet 2 inches, weight  225 pounds.  Temperature 97.2  degrees F, blood pressure 90/50 in both arms.  Respiratory rate 16, pulse 60.  CARDIAC:  Heart sounds are distant but regular rate and rhythm.  CHEST:  Lungs clear to auscultation bilaterally.  No wheeze, rhonchi, rales  noted.  ABDOMEN:  Soft, nontender.  Bowel sounds x4 quadrants.  No  hepatosplenomegaly noted.  NECK:  JVD is midline.  No lymphadenopathy.  Carotids are 2+ without bruits.  The patient has no tenderness with palpation along the cervical vertebral  column.  The patient has good range of motion of the cervical spine without  pain.  BACK:  No tenderness with palpation over the thoracic spine.  The patient  does have tenderness over the lower lumbar region and also right lower  lumbar paraspinous region.  HEENT:  Head is normocephalic and atraumatic without frontal or maxillary  sinus tenderness to palpation.  PERRLA.  Sclerae nonicteric.  Conjunctivae  pink.  EOMs intact.  No external ear deformities noted.  TM on the right is  pearly gray, left is occluded due to heavy cerumen.  Nose and nasal septum  midline.  Nasal mucosa pink and moist without polyps.  Buccal mucosa pink  and moist.  Pharynx without erythema or exudate.  Tongue and uvula midline.  BREASTS/GENITALIA/URINARY/RECTAL: Exams all deferred at this time.  NEUROLOGIC:  The patient is alert and oriented x3.  Cranial nerves II-XII  grossly intact.  Lower extremity strength testing reveals 5/5 strength  throughout.  Deep tendon reflexes at the patella and ankles bilaterally are  2+, equal, and symmetric.  MUSCULOSKELETAL:  Upper extremities are equal and symmetric in size and  shape. The patient does have decreased forward flexion of both shoulders and  can only forward flex to 140 to 160 degrees.  Otherwise elbows, wrists, and  hands have full range of motion.  Radial pulses are 2+ bilaterally.  LOWER EXTREMITIES: The patient has full range of motion of both hips without pain.  Flexion 90 degrees cause no  discomfort.  Left knee full extension,  flexion 125 degrees.  There is crepitus with passive range of motion.  No  effusion and no edema noted in left knee.  No  tenderness along the joint  line with palpation.  Anterior drawer is negative.  Valgus varus stress  negative.  Right knee lacks 2 to 3 degrees in full extension, flexes 115 degrees.  He  has minimal tenderness with palpation along the medial joint line.  Varus  deforming approximately 5 degrees.  Mild crepitus with passive range of  motion.  No effusion, no edema noted.  Valgus varus stressing reveals no  laxity.  Anterior drawer is negative.  Lower extremities not edematous bilaterally.  Does have loss of hair in both  legs.  There are 2 small surgical incisions from vessel harvesting, looks  like minimally invasive vessel harvesting for patient's bypass grafting.  Dorsal and pedal pulses are 2+ bilaterally in the feet.  The patient has  good sensation to light touch throughout the  feet.   X-rays of pelvis and right hip dated April 27, 2005, showed normal pelvis  bony anatomy.   X-rays of the right knee obtained on May 01, 2005, show bone-on-bone  medial compartment right knee.   X-rays of the lumbar spine, 2 views, revealed a bamboo spine in the upper  lumbar region with kissing spurring at multiple levels on the lateral view.  Some facet sclerosis was also noted.   The patient had a markedly abnormal adenosine Cardiolite which demonstrated  a large region of inferolateral ischemia, minimal anterobasal ischemia  noted.  Normal wall motion with EF of 59%.  Adenosine Cardiolite revealed  relatively stable inferior ischemia when compared with the anatomy on the  cardiac catheterization performed in 2000.   IMPRESSION:  1.  Osteoarthritis right knee, end stage.  2.  Lumbar spondylosis.  3.  Coronary artery disease with history of bypass grafting.  4.  Diabetes mellitus, type 2.  5.  Hypertension.  6.  Dyslipidemia.   7.  Hypotension on examination today.  8.  History of elevated liver enzymes.   PLAN:  The patient is to undergo a right total knee arthroplasty using  computer navigation on July 16, 2005.  The patient is to undergo all  preop labs and testing prior to surgery. The patient did receive  preoperative clearance from Dr. Verdis Prime.  The patient is to stay on his  beta blocker perioperatively and postoperatively.  He is also to have IV  nitroglycerin during the procedure.   The patient will follow up with his cardiologist, Dr. Katrinka Blazing, for his blood  pressure and hypotension.      Richardean Canal, P.A.      John L. Rendall, M.D.  Electronically Signed    GC/MEDQ  D:  07/10/2005  T:  07/10/2005  Job:  [id number]   cc:   Lyn Records, M.D.  Fax: [phone]   Leanne Chang, M.D.  Fax: [phone]

## 2011-02-02 NOTE — Discharge Summary (Signed)
Adrian Spencer, Adrian Spencer               ACCOUNT NO.:  [id number]   MEDICAL RECORD NO.:  [id number]          PATIENT TYPE:  INP   LOCATION:  1513                         FACILITY:  [facility]   PHYSICIAN:  John L. Rendall, M.D.  DATE OF BIRTH:  1934/07/13   DATE OF ADMISSION:  07/16/2005  DATE OF DISCHARGE:  07/21/2005                                 DISCHARGE SUMMARY   ADMISSION DIAGNOSES:  1.  End-stage osteoarthritis, right knee.  2.  Lumbar spondylosis.  3.  Coronary artery disease with history of bypass graft.  4.  Type 2 diabetes mellitus.  5.  Hypertension.  6.  Dyslipidemia.  7.  Mild hypotension on the day of preop history and physical.  8.  History of elevated liver functions.   DISCHARGE DIAGNOSES:  1.  End-stage osteoarthritis, right knee, status post right total knee      arthroplasty.  2.  Acute blood loss anemia secondary to surgery.  3.  Hypotension, now improved.  4.  Leukocytosis, resolving.  5.  Hematoma, right leg.  6.  Mild constipation.  7.  Lumbar spondylosis.  8.  Coronary artery disease with history of bypass graft.  9.  Type 2 diabetes mellitus.  10. Hypertension.  11. Dyslipidemia.  12. History of elevated liver function tests.   SURGICAL PROCEDURE:  On July 16, 2005, Adrian Spencer underwent a right total  knee arthroplasty with computer navigation by Dr. Jonny Ruiz L. Rendall, assisted  by Karsten Ro, P.A.-C.  He had an LCS Complete primary femoral component  cemented __________  right with an LCS Complete __________ size large +12.5  mm thickness.  A DePuy MVP keel tibial tray, cemented, size 5 and an LCS  Complete metal-back patella, cemented, size large plus.   COMPLICATIONS:  None.   CONSULTS:  1.  Physical therapy consult on July 17, 2005.  2.  Occupational therapy consult, July 18, 2005.  3.  Rehab medicine consult on July 19, 2005.   HISTORY OF PRESENT ILLNESS:  This 77 year old white male patient presented  to Dr. Priscille Kluver with right leg  pain for the last year.  It is constant,  severe to extremely severe.  It is causing him difficulty with ambulation.  He felt it was due to his hip but x-rays show end stage knee and normal hip.  Because of this and his failure of conservative treatment, he is presenting  for a right knee replacement.   HOSPITAL COURSE:  Adrian Spencer tolerated his surgical procedure well without  immediate postoperative complications.  He was transferred to [facility] [place].  On  the day of surgery, he was afebrile.  Vitals stable.  Hemoglobin 10.7,  hematocrit 30.9.  He had a mildly positive Homans' sign that was monitored.  Diastolic was low, and that was also monitored.  That was the day of  surgery.   On postop day #1, he remained afebrile.  Diastolic was still low, but he was  asymptomatic.  Hemoglobin was 9.1, hematocrit 26.1.  He was subsequently  transfused with 2 units of packed red blood cells.  He was started on  therapy.   On postop day #2, he was feeling better.  Voiding without difficulty.  T max  99.3, BP 160/69.  Hemoglobin improved to 12.1.  Hematocrit 35.1.  He was  noted to have an elevated white count but was asymptomatic.  He did appear  to have a small hematoma in his right side that was treated with a K pad and  monitored.  He was continued on therapy.   He continued to make slow progress over the next several days.  He remained  basically afebrile.  Hemoglobin is stabilized at 10.3, hematocrit 29.9.  White count had increased on November 3 to 19.5.  It is felt that he might  have had a urinary tract infection.  A repeat UA and C&S was obtained, and  he was started on Cipro 250 b.i.d. for 7 days.   Constipation was treated with a laxative.  It was felt on November 4th that  he was doing well enough for transfer to rehab, and he was able to be  transferred to rehab at that time.   DIET:  He can continue his current diabetic diet with adjustments to be made  per the rehab physician.    MEDICATIONS:  He is to continue his current hospitalization meds with  adjustments to be made per the rehab physicians.   HOME MEDICATIONS:  1.  Metoprolol 50 mg p.o. b.i.d.  2.  Isosorbide 60 mg p.o. q.a.m.  3.  Actos 50 mg p.o. q.a.m.  4.  Zetia 10 mg p.o. q.a.m.  5.  Altace 10 mg p.o. q.a.m.  6.  Crestor 10 mg p.o. q.a.m.  7.  __________ 2 mg p.o. q.a.m.  8.  Folic acid 400 mg p.o. q.a.m.  9.  Welchol 3 tablets p.o. b.i.d.  10. Aspirin 325 mg p.o. q.a.m.  He is not to resume until he has completed      his Arixtra.  11. Arixtra 2.5 mg subcu q.8. p.m. x7 days after surgery.  12. He is on sliding-scale insulin protocol, Humulin regular.  Moderately      insulin sensitive.  13. Cipro 250 mg p.o. b.i.d. x7 days.  14. OxyContin 10 mg p.o. q.12h.  15. Percocet 1-2 tablets p.o. q.4h. p.r.n. pain.  16. Robaxin 500 mg 1-2 tablets p.o. q.6h. p.r.n. spasms.   ACTIVITY:  He could be out of bed weightbearing as tolerated on the right  leg with the use of the walker.  He is to have PT/OT per rehab protocols.  He is to continue CPM 0-100 degrees for 8 hours a day.   WOUND CARE:  Please clean the right knee incision with Betadine daily and  apply dry dressing.  Staples can be removed with Steri-Strips with benzoin  applied on postop day #14 if he is still in rehab.  Otherwise, he needs to  follow up with [place] at that time for first postop check and staple removal.  Please notify Dr. Priscille Kluver if temp greater than 101.5, chills, pain  unrelieved by pain medicines or foul-smelling drainage from the wound.   FOLLOW UP:  He needs to follow up with Dr. Priscille Kluver in our office on postop  day #14 if staples are still in place, otherwise he can follow up with [place] a  week or so after discharge from rehab.  He needs to call [phone] for that  appointment.   LABORATORY DATA:  Hemoglobin and hematocrit range from 12.5 and 35.9 on  October 25th to a low of 9.1  and 26.1 on November 1 to 10.3 and 30.6  on November 4.  White count ranged from 7.4 on October 25th to a high of 19.5  on November 3 to 14.8 on the 4th.  Platelets remained within normal limits.  Glucose ranged from a low of 113 on November 4th to a high of 172 on October  31.  Urinalysis on October 25th was negative.  On November 3, it showed  turbid, amber urine, large hemoglobin, small bilirubin, 40 mcg/dl ketones,  161 mcg/dl protein, large leukocyte esterase, a few epithelials, too-  numerous-to-count white cells, few bacteria, and some sperm.  November 3  urine culture showed greater than 100,000 colonies/ml of Pseudomonas, which  was sensitive to Cipro, Ancef, gentamicin, Imipenem.  Urine culture on the  25th showed no growth.      Legrand Pitts Duffy, P.A.      John L. Rendall, M.D.  Electronically Signed    KED/MEDQ  D:  07/25/2005  T:  07/25/2005  Job:  [id number]   cc:   Leanne Chang, M.D.  Fax: [phone]

## 2011-02-02 NOTE — Op Note (Signed)
NAMEHIROTO, SALTZMAN               ACCOUNT NO.:  [id number]   MEDICAL RECORD NO.:  [id number]          PATIENT TYPE:  INP   LOCATION:  0001                         FACILITY:  [facility]   PHYSICIAN:  John L. Rendall, M.D.  DATE OF BIRTH:  Nov 16, 1933   DATE OF PROCEDURE:  07/16/2005  DATE OF DISCHARGE:                                 OPERATIVE REPORT   PREOPERATIVE DIAGNOSIS:  End-stage osteoarthritis, right knee.   POSTOPERATIVE DIAGNOSIS:  End-stage osteoarthritis, right knee.   SURGICAL PROCEDURES:  Right LCS total knee replacement with computer  navigation assistance.   SURGEON:  John L. Rendall, M.D.   ASSISTANT:  Richardean Canal, P.A.   ANESTHESIA:  General with femoral nerve block.   PATHOLOGY:  The patient has bone against bone in both medial and lateral  compartments with extensive spur formation. He has a fixed 14 degree flexion  contracture and 2 degrees of varus.   PROCEDURE:  Under general anesthesia, the right leg was prepared with  DuraPrep and draped as a sterile field with a leg holder. Standard midline  incision is made everting the patella. The knee is exposed for computer  navigation and mapping. Two pins were then placed in the proximal third  tibia and distal third femur and the arrays were set up. The femoral head is  identified. The medial and lateral malleolus are identified. The proximal  tibia is then mapped and the distal femur is then mapped, all within 0.5 mm  of accuracy reproducibly. Once this was done, the computer was used to  assist in proximal tibial resection. This was done within 1 degree of  anatomical axis. Once this was done, the tensioner was inserted. The knee  was brought into full extension and the flexion and extension gaps were  balanced. Once this was completed, it was found necessary to take 2 more  millimeters off the proximal tibia in order to proceed with an appropriate  distal femoral cut. This was then done.   Attention was then  turned to the anterior and posterior flare of the distal  femur. These were resected using computer navigation assistance. The distal  femoral cut was then done. Flexion and extension gaps were then balanced  with what appeared to be 12.5 mm. Recessing guide was then used. A lamina  spreader was inserted. Remnants of menisci and cruciates were trimmed and  the spurs taken off the back of the femoral condyles. Proximal tibia was  then exposed. It was sized to a #5. Trial reduction then of a #5 tibia, a  12.5 bearing, a large-plus femur was then done. Excellent fit, alignment,  and stability were obtained. The patella was then osteotomized. Three peg  holes were made. With the apparatus in place, the knee was within 2 degrees  of full extension. At this point, the computer was reading about 4 degrees  of varus, but this was felt to be aberrant as the cuts on the end of the  femur and tibia had both then within 1 degree of anatomic axis and the knee  did not appear to be in  varus. Schanz pins were removed. Permanent  components were then obtained. All components were cemented in place. The  knee appeared to be anatomically aligned with full extension. Once cement  hardened, excess cement was removed and the tourniquet was let down at  1 hour 15 minutes. Electrocautery was used. Medium Hemovac drain was used.  The knee was then sutured with #1 Tycron and #1 and 2-0 Vicryl, and skin  clips. Operative time 1 hour 30 minutes. The patient tolerated the procedure  well and returned to recovery in good condition.      John L. Rendall, M.D.  Electronically Signed     JLR/MEDQ  D:  07/16/2005  T:  07/16/2005  Job:  [id number]

## 2011-02-02 NOTE — Procedures (Signed)
Adrian Spencer, Adrian Spencer               ACCOUNT NO.:  [id number]   MEDICAL RECORD NO.:  [id number]          PATIENT TYPE:  OUT   LOCATION:  SLEEP CENTER                 FACILITY:  [facility]   PHYSICIAN:  Marcelyn Bruins, M.D. [facility] DATE OF BIRTH:  1934/08/26   DATE OF STUDY:  04/06/2006                              NOCTURNAL POLYSOMNOGRAM   REFERRING PHYSICIAN:  Dr. Leanne Chang   DATE OF STUDY:  April 06, 2006   INDICATION FOR STUDY:  Hypersomnia with sleep apnea.  Epworth score was 14.   RESPIRATORY DATA:  The patient underwent split night protocol where he was  found to have 113 obstructive events in the first 131 minutes of sleep.  This gave the patient a Respiratory Disturbance Index of 52 events per hour  during the first half of the night.  There was loud snoring noted throughout  the night, and the events were clearly worse in the supine position.  By  protocol the patient was then placed on a small ComfortGel CPAP mask and  ultimately titrated to 10 cm H2O for control of both obstructive events and  snoring.   OXYGEN DATA:  The patient had O2 desaturation as low as 83% with his  obstructive events.   CARDIAC DATA:  Frequent PVCs were noted throughout the night.   MOVEMENT/PARASOMNIA:  The patient was found to have 178 leg jerks with 1.6  per hour resulting in arousal or awakening.   IMPRESSION/RECOMMENDATION:  1.  Split night study revealed severe obstructive sleep apnea/hypopnea      syndrome with a Respiratory Disturbance Index of 52 events per hour      during the first half of the night, and oxygen desaturation as low as      83%.  The patient was then placed on a small ComfortGel continuous      positive airway pressure mask and titrated to a final pressure of 10 cm      H2O with excellent control of both his obstructive events and snoring.  2.  Frequent premature ventricular contractions noted throughout the night.  3.  Very large numbers of leg jerks with only mild sleep  disruption.      Clinical correlation is suggested if the patient fails to respond      adequately to treatment of his sleep-disordered breathing.           ______________________________  Marcelyn Bruins, M.D. [facility]  Diplomate, American Board of Sleep  Medicine    KC/MEDQ  D:  04/12/2006 16:37:49  T:  04/12/2006 21:08:49  Job:  [id number]

## 2011-02-02 NOTE — Discharge Summary (Signed)
Adrian Spencer, Adrian Spencer               ACCOUNT NO.:  [id number]   MEDICAL RECORD NO.:  [id number]          PATIENT TYPE:  ORB   LOCATION:  4522                         FACILITY:  [facility]   PHYSICIAN:  Ellwood Dense, M.D.   DATE OF BIRTH:  1934/09/06   DATE OF ADMISSION:  07/21/2005  DATE OF DISCHARGE:  07/27/2005                                 DISCHARGE SUMMARY   DISCHARGE DIAGNOSES:  1.  Right total knee replacement.  2.  Diabetes mellitus Type 2.  3.  __________  arthrodesis.  4.  Acute blood loss anemia.  5.  Hypertension.  6.  Postoperative confusion, resolved.   HISTORY OF THE PRESENT ILLNESS:  Adrian Spencer is a 77 year old man with a  history of coronary artery disease, diabetes mellitus, and right lower  extremity pain with extensive __________ , bone-on-bone degenerative changes  of the right knee.  He has had very conservative therapy and elected to  undergo right total knee replacement on July 16, 2005 by Dr. Priscille Kluver.  Postoperatively he was weightbearing as tolerated and on subcu __________  for DVT prophylaxis.  He did require two __________  for acute blood loss  anemia.  He was noted to have leukocytosis with white count going to 19.5 on  July 20, 2005 and a positive UA, thus he was started on Cipro  empirically for questionable UTI.  He was noted to have some issues with  lethargy and OxyContin that was used for pain control was discontinued.   Currently his  functional mobility  and ADLs are impaired; and, the SACU was  consulted prior to therapy.   PAST MEDICAL HISTORY:  1.  The past medical history is significant for right knee scope for      meniscectomy in 1984.  2.  Back surgery in 1991.  3.  CABG in 1999.  4.  Diabetes mellitus Type 2.  5.  Dyslipidemia.   ALLERGIES:  No known drug allergies.   FAMILY HISTORY:  The family history is positive for dementia, coronary  artery disease, diabetes and CVA.   SOCIAL HISTORY:  The patient is married.  He  works part-time in his own  business.  He lives in a one-level  home with a ramp at the entry.  He has  not used any tobacco for 20 years.  He drinks alcohol occasionally.  He was  independent and driving prior to admission.   HOSPITAL COURSE:  Adrian Spencer was admitted to the subacute care unit  on July 21, 2005 for SACU therapies to consist of PT/OT daily.  After  admission pain control was warranted and he was on p.o. OxyContin.  Initial  OxyContin CR was continued; however, the patient had excessive lethargy and  this was discontinued and Ultram was scheduled instead at 50 mg three times  a day with pain controlled without  sedative side effects.   Labs were done after admission, which revealed a hemoglobin of 10.0,  hematocrit 29.4, white count 10.8 and platelets 312,000.  Check of  electrolytes revealed sodium 135, potassium 3.5, chloride 102, CO2 28, BUN  17, creatinine 1.2, and glucose 126.  Check of the LFTs revealed, SGPT  36.SGPT 21, alkaline phosphatase 277, and total bilirubin 2.8.  The patient  was without any GI symptomatology therefore __________ .  His __________  and Zocor  were placed on hold.  The patient was placed on Cipro for seven  total days of antibiotic therapy for Pseudomonas UTI.  Recheck of his LFTs  was done at the time of discharge and showed the total bilirubin to be  improved at 1.6, but SGOT and SGPT were slightly elevated with SGOT at 50  and SGPT at 69.  We continued to hold __________  and Zocor as well as  Zetia; and, he is follow up with Dr. Blossom Hoops for recheck of LFTs  in two  week.  However, some of the elevation in the LFTs could be due to Cipro use.  If repeat LFTs continue to be elevated further workup will be indicated.   The patient's right knee incision has been healing without any signs or  symptoms of infection, but moderate edema continues to be present.  The  staples remain in place and the patient is to follow up with Dr.  Priscille Kluver for  staple removal in the next three to five days.   During his stay in the SACU Adrian Spencer has progressed along to being at  modified independent level for transfers, moderate independent levels of  ambulating 300 feet with a rolling walker.  He is at supervision levels for  ADLs and toileting.  He is independent for feeding and grooming.  Fur follow  up therapies are to include home health, PT/OT by [facility] after discharge.   On July 27, 2005 the patient was discharged to home.   DISCHARGE MEDICATIONS:  1.  Imdur 60 mg q.a.m.  2.  __________  2 mg a day.  3.  Altace 10 mg a day.  4.  Actos 15 mg a day.  5.  Folic acid 0.5 mg a day.  6.  Ultram 50 mg three times a day.  7.  Lopressor 50 mg daily.  8.  OxyIR 5-10 mg q.4-6 hours p.r.n. pain.  9.  Robaxin 500 mg q.i.d.  10. Coated aspirin 325 mg a day.   ACTIVITY:  The patient's activity s as tolerated with the use of a walker.  No driving for four to six weeks   DIET:  The patient's diet is diabetic.   WOUND CARE:  The patient is wash is wound everyday with warm soap and water,  and keep it clean and dry.   SPECIAL INSTRUCTIONS:  No alcohol.  No driving for four to six weeks as  mentioned above.  Do not take __________ , Zocor or Zetia.   FOLLOW UP:  The patient is to follow up with Dr. Priscille Kluver for staple removal  in three to five days.  He is follow up with Dr. Blossom Hoops for recheck of  his LFTs in the two weeks.  He is follow up with Dr. Thomasena Edis as needed.      Greg Cutter, P.A.    ______________________________  Ellwood Dense, M.D.    PP/MEDQ  D:  07/27/2005  T:  07/29/2005  Job:  [id number]   cc:   Jonny Ruiz L. Rendall, M.D.  Fax: [phone]   Leanne Chang, M.D.  Fax: [phone]

## 2011-02-02 NOTE — Assessment & Plan Note (Signed)
[facility]                           GASTROENTEROLOGY OFFICE NOTE   NAME:Adrian Spencer, Adrian Spencer                      MRN:          [id number]  DATE:04/16/2006                            DOB:          25-Jun-1934    REFERRING PHYSICIAN:  Leanne Chang, MD   REASON FOR REFERRAL:  Anemia.   Mr. Braddy is a 77 year old white male referred through the courtesy of Dr.  Blossom Hoops. He was recently found to have a normocytic anemia with a  hemoglobin of 12.3 and an MCV of 91. The patient relates having blood  transfusions in October or November of 2006 around the time of a total right  knee replacement.  He does not recall any prior problems with anemia. He has  had ongoing constipation since October of 2006 which has been attributed to  pain medication usage following his right total knee replacement.  He has  not previously had colonoscopy or barium enema.  There is no family history  of colon cancer, colon polyps, inflammatory bowel disease.  He notes no  melena, hematochezia, change in bowel habits, change in stool caliber,  diarrhea, or weight loss.  He notes no dyspeptic symptoms, reflux symptoms,  dysphagia or odynophagia.   PAST MEDICAL HISTORY:  1.  Hypertension.  2.  Diabetes mellitus.  3.  Hyperlipidemia.  4.  Status post CVA.  5.  Arthritis.  6.  Sleep apnea.  7.  Coronary artery disease status post CABG in 1999.  8.  Status post appendectomy at age 23.  38.  Status post right knee arthroscopy in 1984.  10. Status post back surgery in 1991.  11. Status post right total knee replacement in October, 2006.   CURRENT MEDICATIONS:  Listed on the chart updated and reviewed.   ALLERGIES:  No known drug allergies.   SOCIAL HISTORY:  Per hand written form.   REVIEW OF SYSTEMS:  Entirely negative per hand written form.   PHYSICAL EXAMINATION:  GENERAL:  Overweight white male in no acute distress.  VITAL SIGNS:  Height 6 feet, 2 inches. Weight  224.8 pounds. Blood pressure  is 132/60, pulse 66 and regular.  HEENT EXAM:  Anicteric sclerae. Oropharynx clear.  CHEST:  Clear to auscultation bilaterally.  CARDIAC:  Regular rate and rhythm without murmurs appreciated.  ABDOMEN:  Soft, nontender, nondistended. Normal active bowel sounds. No  palpable organomegaly, masses or hernias.  RECTAL EXAMINATION:  Deferred at time of colonoscopy.  NEUROLOGIC:  Alert and oriented, grossly nonfocal.   ASSESSMENT AND PLAN:  Normocytic anemia. Rule out occult gastrointestinal  losses. Obtain stool Hemoccults, iron studies, B12 and folate today. Rule  out colorectal neoplasm and other colonic sources of occult blood loss.  Risks, benefits and alternatives of colonoscopy with possible biopsy and  possible polypectomy discussed with the patient and he consents to proceed.  This will be scheduled electively.   Mild constipation responsive to stool softeners. Likely related to pain  medications and decreased mobility. Colonoscopy to assess for other causes.  Venita Lick. Pleas Koch., MD, Clementeen Graham   MTS/MedQ  DD:  04/16/2006  DT:  04/16/2006  Job #:  [id number]   cc:   Leanne Chang, MD

## 2011-06-25 ENCOUNTER — Encounter: Payer: Self-pay | Admitting: Gastroenterology

## 2012-02-18 ENCOUNTER — Encounter: Payer: Self-pay | Admitting: Family Medicine

## 2012-02-18 ENCOUNTER — Telehealth: Payer: Self-pay

## 2012-02-18 ENCOUNTER — Other Ambulatory Visit: Payer: Self-pay | Admitting: Family Medicine

## 2012-02-18 ENCOUNTER — Ambulatory Visit (INDEPENDENT_AMBULATORY_CARE_PROVIDER_SITE_OTHER): Payer: Medicare Other | Admitting: Family Medicine

## 2012-02-18 ENCOUNTER — Ambulatory Visit (HOSPITAL_BASED_OUTPATIENT_CLINIC_OR_DEPARTMENT_OTHER)
Admission: RE | Admit: 2012-02-18 | Discharge: 2012-02-18 | Disposition: A | Discharge status: Home / Self Care | Payer: Medicare Other | Source: Ambulatory Visit | Attending: Family Medicine | Admitting: Family Medicine

## 2012-02-18 VITALS — BP 132/60 | HR 73 | Temp 98.1°F | Wt 235.2 lb

## 2012-02-18 DIAGNOSIS — M79609 Pain in unspecified limb: Secondary | ICD-10-CM

## 2012-02-18 DIAGNOSIS — S92902A Unspecified fracture of left foot, initial encounter for closed fracture: Secondary | ICD-10-CM

## 2012-02-18 DIAGNOSIS — M79672 Pain in left foot: Secondary | ICD-10-CM

## 2012-02-18 DIAGNOSIS — X58XXXA Exposure to other specified factors, initial encounter: Secondary | ICD-10-CM | POA: Insufficient documentation

## 2012-02-18 DIAGNOSIS — S92919A Unspecified fracture of unspecified toe(s), initial encounter for closed fracture: Secondary | ICD-10-CM | POA: Insufficient documentation

## 2012-02-18 DIAGNOSIS — S92502A Displaced unspecified fracture of left lesser toe(s), initial encounter for closed fracture: Secondary | ICD-10-CM

## 2012-02-18 LAB — BASIC METABOLIC PANEL
Calcium: 9 mg/dL (ref 8.4–10.5)
GFR: 53.66 mL/min — ABNORMAL LOW (ref >60.00)
Glucose, Bld: 205 mg/dL — ABNORMAL HIGH (ref 70–99)
Potassium: 3.9 mEq/L (ref 3.5–5.1)
Sodium: 138 mEq/L (ref 135–145)

## 2012-02-18 LAB — URIC ACID: Uric Acid, Serum: 6.6 mg/dL (ref 4.0–7.8)

## 2012-02-18 MED ORDER — TRAMADOL HCL 50 MG PO TABS: 50.0000 mg | ORAL_TABLET | Freq: Two times a day (BID) | ORAL | Status: AC | PRN

## 2012-02-18 NOTE — Progress Notes (Signed)
  Subjective:    Adrian Spencer is a 78 y.o. male who presents with left foot pain. Onset of the symptoms was a week ago. Precipitating event: injured stubbed toe --kicked leg of furniture. Current symptoms include: ability to bear weight, but with some pain, redness and swelling. Aggravating factors: any weight bearing. Symptoms have gradually worsened. Patient has had no prior foot problems. Evaluation to date: none. Treatment to date: none.  The following portions of the patient's history were reviewed and updated as appropriate: allergies, current medications, past family history, past medical history, past social history, past surgical history and problem list.  Review of Systems Pertinent items are noted in HPI.    Objective:    BP 132/60  Pulse 73  Temp(Src) 98.1 F (36.7 C) (Oral)  Wt 235 lb 3.2 oz (106.686 kg)  SpO2 99% Right foot:  normal exam, no swelling, tenderness, instability; ligaments intact, full range of motion of all ankle/foot joints  Left foot:  soft tissue swelling noted over the  first and 5th metatarsal phlanges and tenderness of the 1st, 4th and 5th metatarsal head   Imaging: X-ray of the left foot: not available    Assessment:    Non-specific foot pain    Plan:    Natural history and expected course discussed. Questions answered. Agricultural engineer distributed. Rest, ice, compression, and elevation (RICE) therapy. xray ordered  ? Gout vs sprain/strain---check lab

## 2012-02-18 NOTE — Telephone Encounter (Signed)
Message copied by Arnette Norris on Mon Feb 18, 2012  4:52 PM ------      Message from: Lelon Perla      Created: Mon Feb 18, 2012  3:32 PM       + fracture--- refer to ortho

## 2012-02-18 NOTE — Telephone Encounter (Signed)
Discussed with patient and he voiced understanding, referral already pending, patient aware to rest, use ice and elevate until he is contacted with apt information.    KP

## 2012-02-18 NOTE — Patient Instructions (Signed)
Gout Gout is an inflammatory condition (arthritis) caused by a buildup of uric acid crystals in the joints. Uric acid is a chemical that is normally present in the blood. Under some circumstances, uric acid can form into crystals in your joints. This causes joint redness, soreness, and swelling (inflammation). Repeat attacks are common. Over time, uric acid crystals can form into masses (tophi) near a joint, causing disfigurement. Gout is treatable and often preventable. CAUSES  The disease begins with elevated levels of uric acid in the blood. Uric acid is produced by your body when it breaks down a naturally found substance called purines. This also happens when you eat certain foods such as meats and fish. Causes of an elevated uric acid level include:  Being passed down from parent to child (heredity).   Diseases that cause increased uric acid production (obesity, psoriasis, some cancers).   Excessive alcohol use.   Diet, especially diets rich in meat and seafood.   Medicines, including certain cancer-fighting drugs (chemotherapy), diuretics, and aspirin.   Chronic kidney disease. The kidneys are no longer able to remove uric acid well.   Problems with metabolism.  Conditions strongly associated with gout include:  Obesity.   High blood pressure.   High cholesterol.   Diabetes.  Not everyone with elevated uric acid levels gets gout. It is not understood why some people get gout and others do not. Surgery, joint injury, and eating too much of certain foods are some of the factors that can lead to gout. SYMPTOMS   An attack of gout comes on quickly. It causes intense pain with redness, swelling, and warmth in a joint.   Fever can occur.   Often, only one joint is involved. Certain joints are more commonly involved:   Base of the big toe.   Knee.   Ankle.   Wrist.   Finger.  Without treatment, an attack usually goes away in a few days to weeks. Between attacks, you  usually will not have symptoms, which is different from many other forms of arthritis. DIAGNOSIS  Your caregiver will suspect gout based on your symptoms and exam. Removal of fluid from the joint (arthrocentesis) is done to check for uric acid crystals. Your caregiver will give you a medicine that numbs the area (local anesthetic) and use a needle to remove joint fluid for exam. Gout is confirmed when uric acid crystals are seen in joint fluid, using a special microscope. Sometimes, blood, urine, and X-ray tests are also used. TREATMENT  There are 2 phases to gout treatment: treating the sudden onset (acute) attack and preventing attacks (prophylaxis). Treatment of an Acute Attack  Medicines are used. These include anti-inflammatory medicines or steroid medicines.   An injection of steroid medicine into the affected joint is sometimes necessary.   The painful joint is rested. Movement can worsen the arthritis.   You may use warm or cold treatments on painful joints, depending which works best for you.   Discuss the use of coffee, vitamin C, or cherries with your caregiver. These may be helpful treatment options.  Treatment to Prevent Attacks After the acute attack subsides, your caregiver may advise prophylactic medicine. These medicines either help your kidneys eliminate uric acid from your body or decrease your uric acid production. You may need to stay on these medicines for a very long time. The early phase of treatment with prophylactic medicine can be associated with an increase in acute gout attacks. For this reason, during the first few months   of treatment, your caregiver may also advise you to take medicines usually used for acute gout treatment. Be sure you understand your caregiver's directions. You should also discuss dietary treatment with your caregiver. Certain foods such as meats and fish can increase uric acid levels. Other foods such as dairy can decrease levels. Your caregiver  can give you a list of foods to avoid. HOME CARE INSTRUCTIONS   Do not take aspirin to relieve pain. This raises uric acid levels.   Only take over-the-counter or prescription medicines for pain, discomfort, or fever as directed by your caregiver.   Rest the joint as much as possible. When in bed, keep sheets and blankets off painful areas.   Keep the affected joint raised (elevated).   Use crutches if the painful joint is in your leg.   Drink enough water and fluids to keep your urine clear or pale yellow. This helps your body get rid of uric acid. Do not drink alcoholic beverages. They slow the passage of uric acid.   Follow your caregiver's dietary instructions. Pay careful attention to the amount of protein you eat. Your daily diet should emphasize fruits, vegetables, whole grains, and fat-free or low-fat milk products.   Maintain a healthy body weight.  SEEK MEDICAL CARE IF:   You have an oral temperature above 102 F (38.9 C).   You develop diarrhea, vomiting, or any side effects from medicines.   You do not feel better in 24 hours, or you are getting worse.  SEEK IMMEDIATE MEDICAL CARE IF:   Your joint becomes suddenly more tender and you have:   Chills.   An oral temperature above 102 F (38.9 C), not controlled by medicine.  MAKE SURE YOU:   Understand these instructions.   Will watch your condition.   Will get help right away if you are not doing well or get worse.  Document Released: 08/31/2000 Document Revised: 08/23/2011 Document Reviewed: 12/12/2009 [employer] Patient Information 2012 [employer]. 

## 2012-06-10 ENCOUNTER — Encounter: Payer: Self-pay | Admitting: Gastroenterology

## 2013-07-26 IMAGING — CR DG FOOT COMPLETE 3+V*L*
3 series · 3 of 3 positions shown · non-contrast
Comparison: None.

CLINICAL DATA: Fifth toe injury, pain.

LEFT FOOT - COMPLETE 3+ VIEW

[t foot ap left]
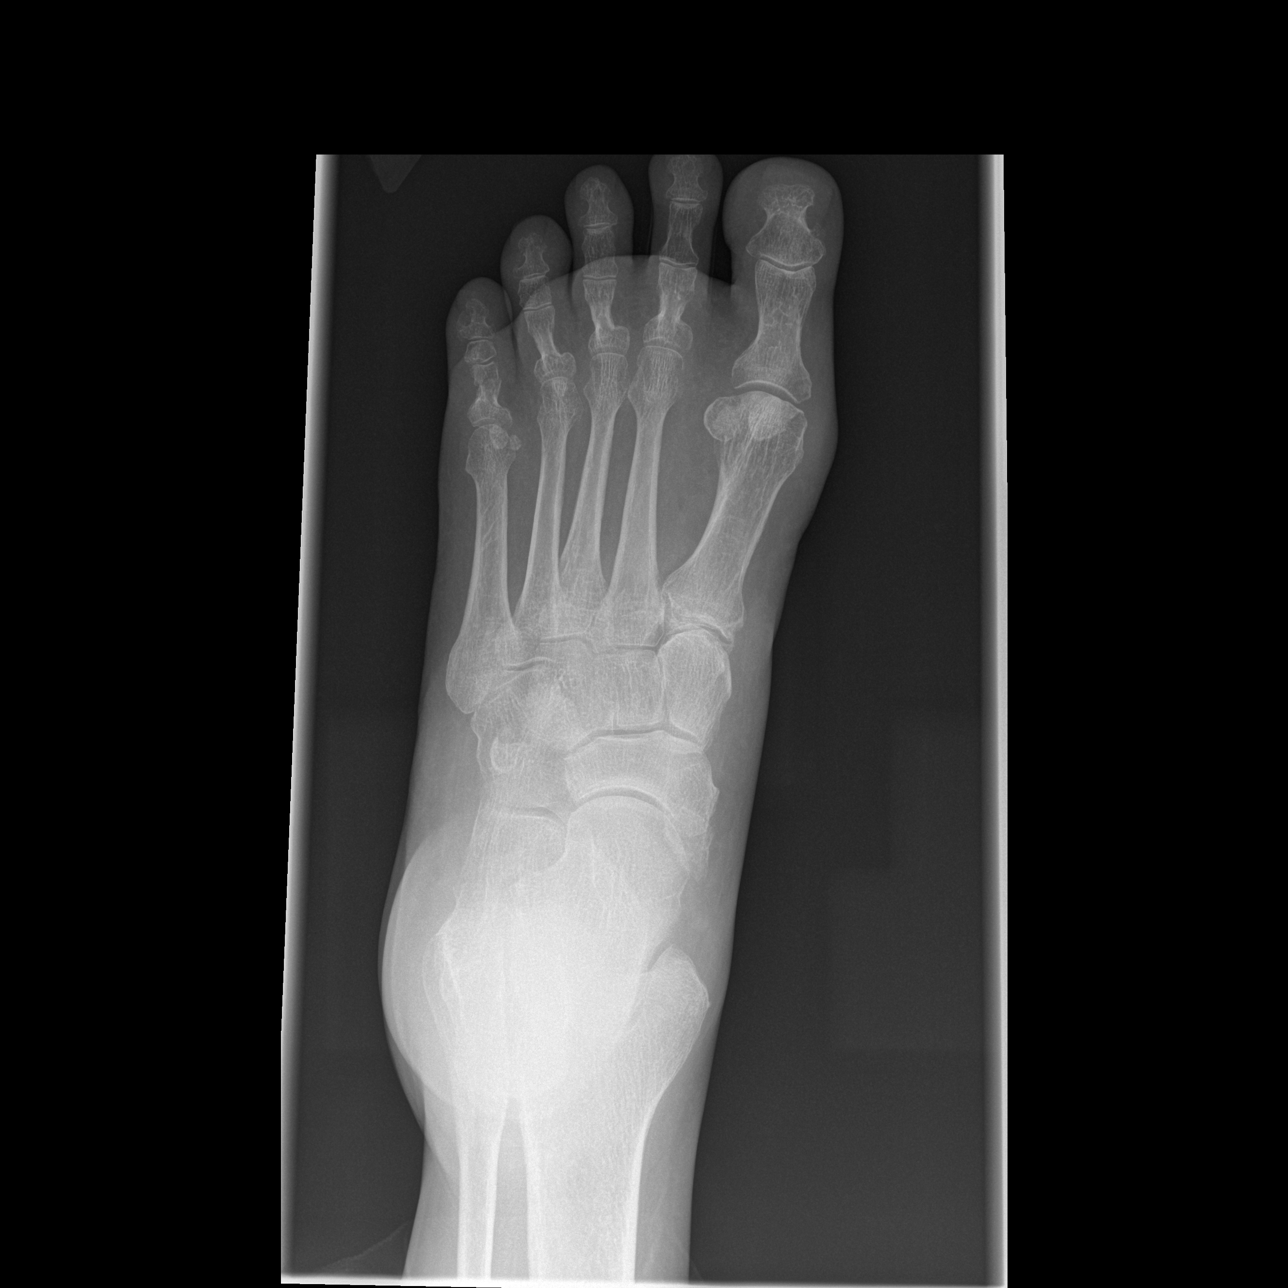

[t foot oblique left]
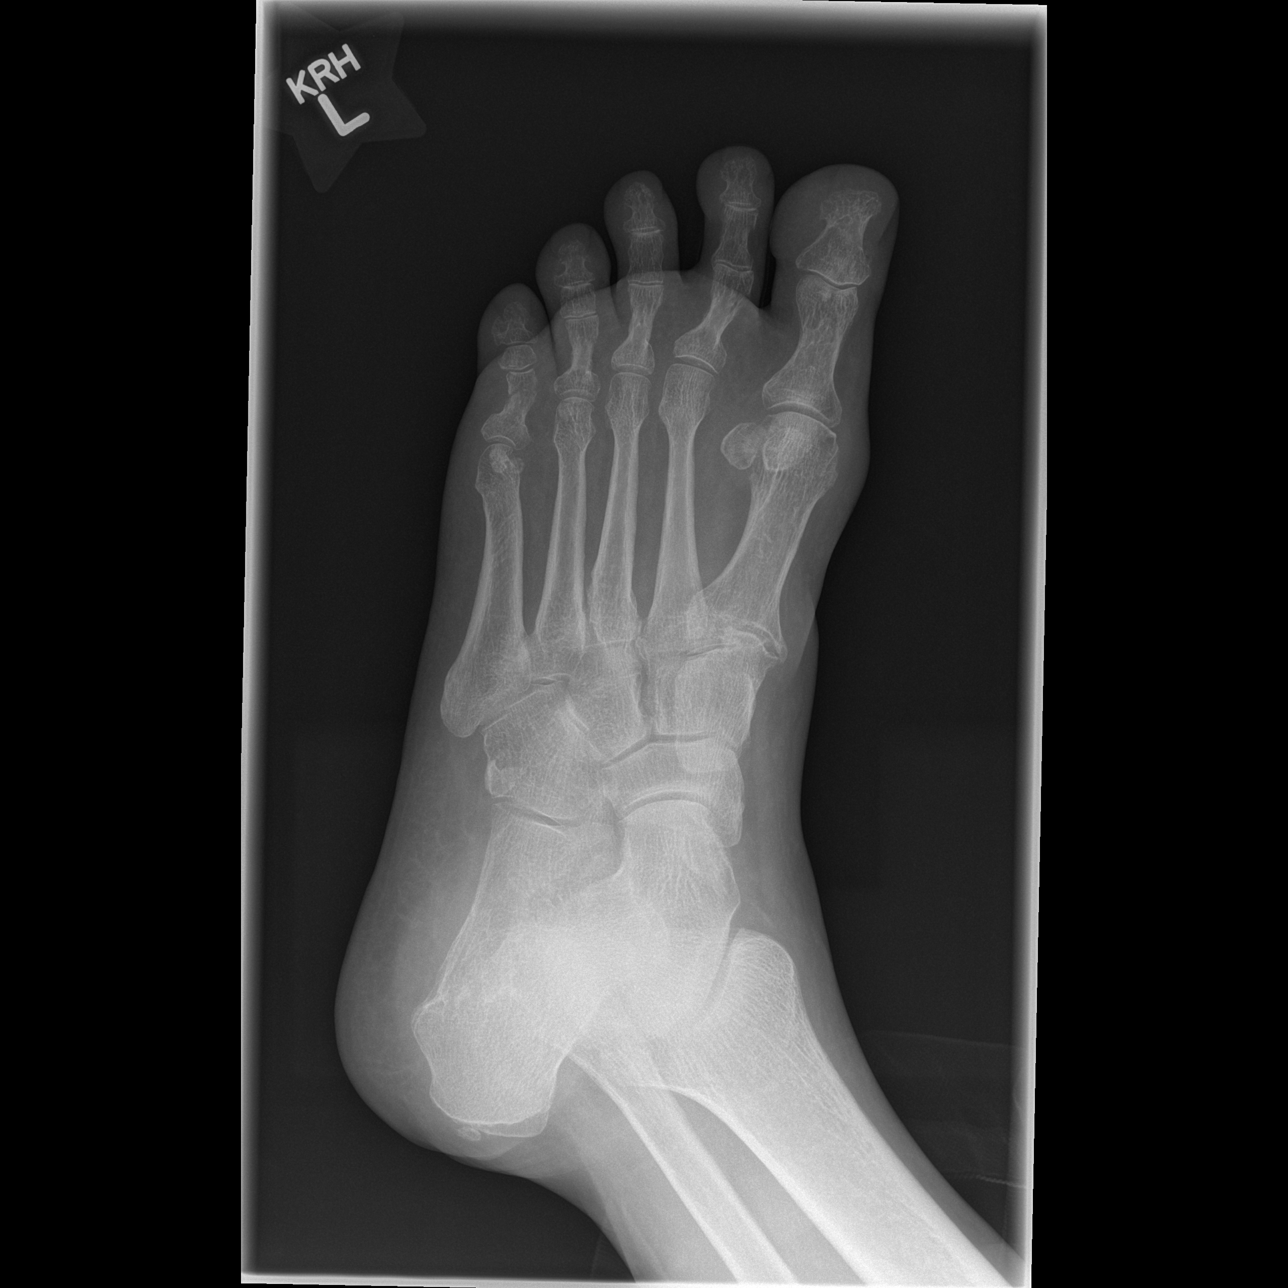

[t foot lat left]
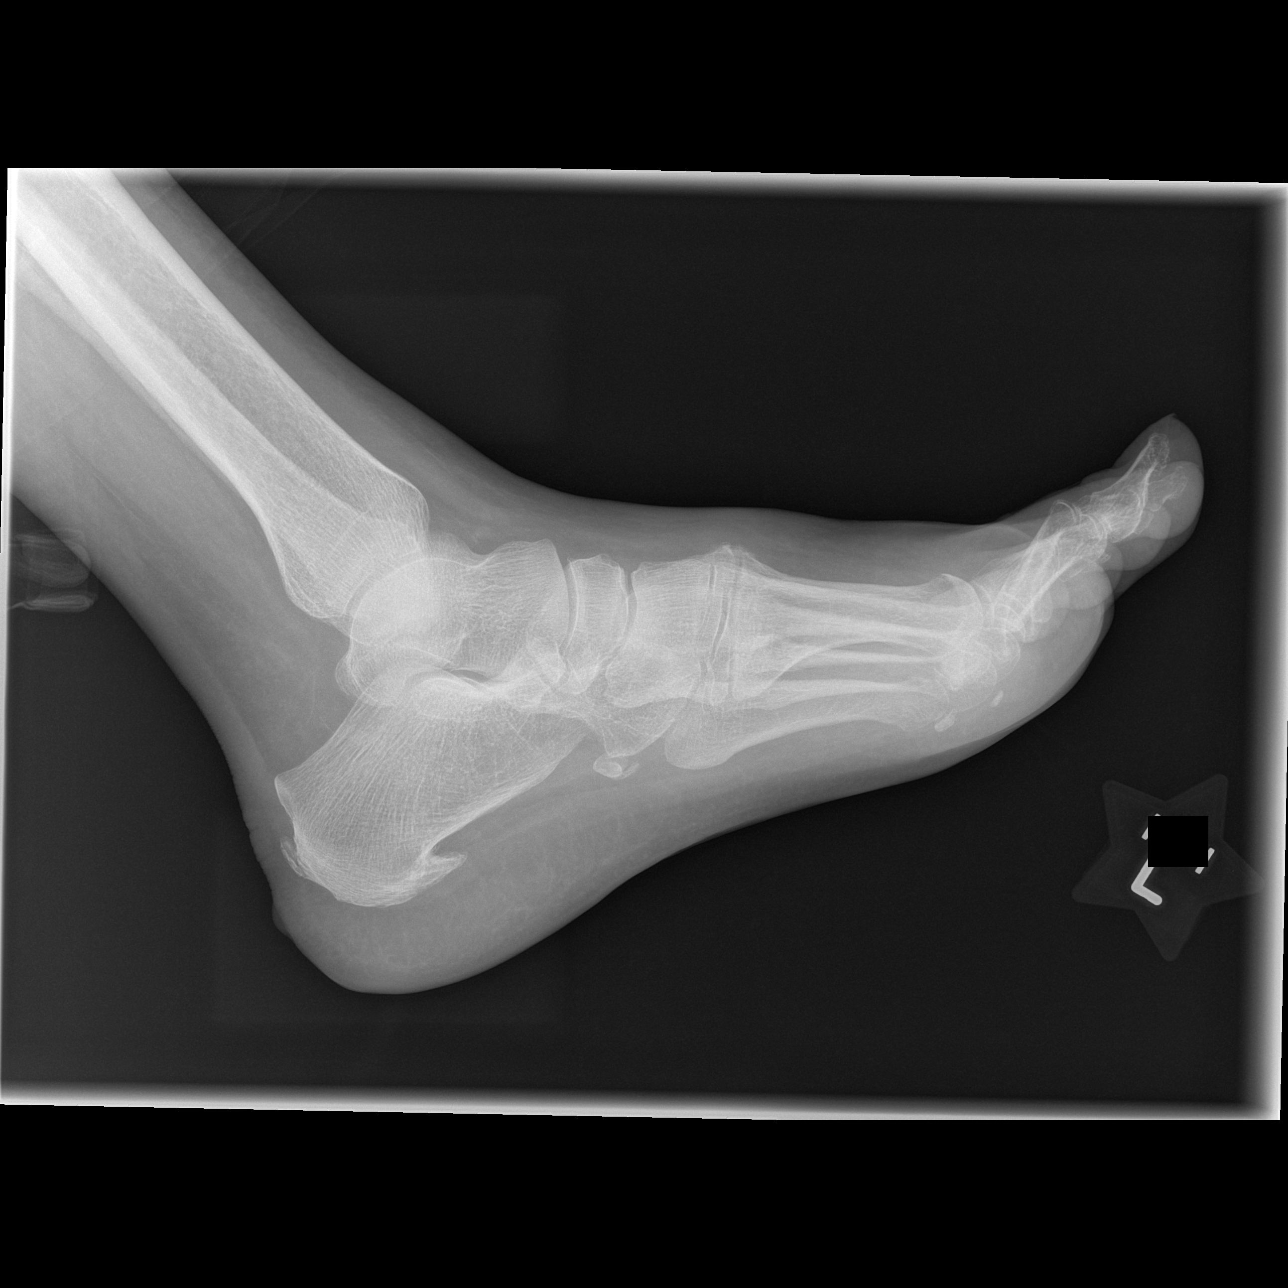

[3 of 3 positions shown; findings below may reference images not displayed]

FINDINGS: The mildly comminuted, essentially nondisplaced fractures
through the proximal phalanx of the left fifth toe.  No additional
acute bony abnormality.  Soft tissues are intact.  Mild
degenerative changes in the midfoot.
IMPRESSION: Mildly comminuted, minimally-displaced fracture through the
proximal fifth phalanx.

## 2014-05-17 ENCOUNTER — Telehealth: Payer: Self-pay | Admitting: Interventional Cardiology

## 2014-05-17 NOTE — Telephone Encounter (Signed)
New message     Pt want to talk to Dr Tamala Julian only.   He is disappointed that when he came in last week for his annual appt---he was told he did not have an appt.  An appt was made for 06-21-14.  He want to cancel that appt and talk to Dr Tamala Julian. He said he has been a pt of Dr Tamala Julian for years.

## 2014-05-17 NOTE — Telephone Encounter (Signed)
Pt requests to talk with Dr Tamala Julian. 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  Will forward to Dr Tamala Julian.

## 2014-05-18 NOTE — Telephone Encounter (Signed)
Discussed he is moved to [facility] and recommended that he see one of the cardiologists in [facility].Adrian Spencer

## 2014-06-21 ENCOUNTER — Ambulatory Visit: Payer: Medicare Other | Admitting: Interventional Cardiology

## 2014-06-28 ENCOUNTER — Encounter: Payer: Self-pay | Admitting: Interventional Cardiology

## 2014-08-05 ENCOUNTER — Encounter: Payer: Self-pay | Admitting: Interventional Cardiology

## 2019-12-28 ENCOUNTER — Telehealth: Payer: Self-pay

## 2019-12-28 NOTE — Telephone Encounter (Signed)
Left message with patient's daughter offering to schedule visit with Palliative Care

## 2019-12-28 NOTE — Telephone Encounter (Signed)
Phone call placed to patient to offer to schedule a visit with Palliative Care. Unable to leave a VM as mailbox is full.

## 2021-03-17 DEATH — deceased
